# Patient Record
Sex: Male | Born: 1960
Health system: Southern US, Community
[De-identification: ages and names within clinical notes are randomized; demographics above are authoritative.]

## PROBLEM LIST (undated history)

## (undated) DIAGNOSIS — J449 Chronic obstructive pulmonary disease, unspecified: Secondary | ICD-10-CM

## (undated) DIAGNOSIS — R5383 Other fatigue: Secondary | ICD-10-CM

## (undated) DIAGNOSIS — R0602 Shortness of breath: Secondary | ICD-10-CM

## (undated) DIAGNOSIS — F209 Schizophrenia, unspecified: Secondary | ICD-10-CM

## (undated) DIAGNOSIS — R569 Unspecified convulsions: Secondary | ICD-10-CM

## (undated) DIAGNOSIS — F319 Bipolar disorder, unspecified: Secondary | ICD-10-CM

## (undated) DIAGNOSIS — F419 Anxiety disorder, unspecified: Secondary | ICD-10-CM

## (undated) HISTORY — PX: OTHER SURGICAL HISTORY: SHX169

## (undated) HISTORY — DX: Other fatigue: R53.83

## (undated) HISTORY — DX: Shortness of breath: R06.02

## (undated) HISTORY — DX: Anxiety disorder, unspecified: F41.9

## (undated) HISTORY — DX: Bipolar disorder, unspecified: F31.9

## (undated) HISTORY — DX: Schizophrenia, unspecified: F20.9

---

## 2006-11-20 DIAGNOSIS — F319 Bipolar disorder, unspecified: Secondary | ICD-10-CM

## 2006-11-20 DIAGNOSIS — F209 Schizophrenia, unspecified: Secondary | ICD-10-CM

## 2006-11-20 HISTORY — DX: Bipolar disorder, unspecified: F31.9

## 2006-11-20 HISTORY — DX: Schizophrenia, unspecified: F20.9

## 2009-08-11 ENCOUNTER — Ambulatory Visit: Payer: Self-pay | Admitting: Family Medicine

## 2009-12-14 ENCOUNTER — Emergency Department (HOSPITAL_COMMUNITY): Admission: EM | Admit: 2009-12-14 | Discharge: 2009-12-14 | Payer: Self-pay | Admitting: Emergency Medicine

## 2010-06-27 ENCOUNTER — Emergency Department (HOSPITAL_COMMUNITY): Admission: EM | Admit: 2010-06-27 | Discharge: 2010-06-27 | Payer: Self-pay | Admitting: Emergency Medicine

## 2010-07-13 ENCOUNTER — Encounter: Payer: Self-pay | Admitting: Adult Health

## 2010-07-13 ENCOUNTER — Ambulatory Visit: Payer: Self-pay | Admitting: Cardiology

## 2010-07-13 ENCOUNTER — Encounter (INDEPENDENT_AMBULATORY_CARE_PROVIDER_SITE_OTHER): Payer: Self-pay | Admitting: *Deleted

## 2010-07-13 DIAGNOSIS — R079 Chest pain, unspecified: Secondary | ICD-10-CM

## 2010-07-13 DIAGNOSIS — R0602 Shortness of breath: Secondary | ICD-10-CM

## 2010-07-13 DIAGNOSIS — F17200 Nicotine dependence, unspecified, uncomplicated: Secondary | ICD-10-CM | POA: Insufficient documentation

## 2010-07-13 LAB — CONVERTED CEMR LAB
ALT: 28 units/L
AST: 19 units/L
AST: 19 units/L (ref 0–37)
Albumin: 4.8 g/dL (ref 3.5–5.2)
Alkaline Phosphatase: 88 units/L
Alkaline Phosphatase: 88 units/L (ref 39–117)
Bilirubin, Direct: 0.1 mg/dL
HDL: 49 mg/dL
HDL: 49 mg/dL (ref >39)
LDL Cholesterol: 118 mg/dL
LDL Cholesterol: 118 mg/dL — ABNORMAL HIGH (ref 0–99)
TSH: 1.616 microintl units/mL (ref 0.350–4.500)
Total CHOL/HDL Ratio: 3.7
Total Protein: 7.6 g/dL (ref 6.0–8.3)
Triglycerides: 60 mg/dL (ref <150)

## 2010-07-14 ENCOUNTER — Encounter (INDEPENDENT_AMBULATORY_CARE_PROVIDER_SITE_OTHER): Payer: Self-pay | Admitting: *Deleted

## 2010-07-15 ENCOUNTER — Encounter: Payer: Self-pay | Admitting: Cardiology

## 2010-07-15 ENCOUNTER — Telehealth (INDEPENDENT_AMBULATORY_CARE_PROVIDER_SITE_OTHER): Payer: Self-pay

## 2010-07-21 ENCOUNTER — Encounter: Payer: Self-pay | Admitting: Cardiology

## 2010-07-26 ENCOUNTER — Encounter (INDEPENDENT_AMBULATORY_CARE_PROVIDER_SITE_OTHER): Payer: Self-pay

## 2010-07-26 ENCOUNTER — Encounter: Payer: Self-pay | Admitting: Adult Health

## 2010-07-27 ENCOUNTER — Ambulatory Visit (HOSPITAL_COMMUNITY): Admission: RE | Admit: 2010-07-27 | Discharge: 2010-07-27 | Payer: Self-pay | Admitting: Cardiology

## 2010-07-27 ENCOUNTER — Ambulatory Visit: Payer: Self-pay | Admitting: Cardiology

## 2010-07-27 ENCOUNTER — Encounter: Payer: Self-pay | Admitting: Cardiology

## 2010-07-28 ENCOUNTER — Ambulatory Visit: Payer: Self-pay | Admitting: Adult Health

## 2010-08-03 ENCOUNTER — Ambulatory Visit: Payer: Self-pay | Admitting: Family Medicine

## 2010-08-03 DIAGNOSIS — M545 Low back pain: Secondary | ICD-10-CM

## 2010-08-03 DIAGNOSIS — F99 Mental disorder, not otherwise specified: Secondary | ICD-10-CM

## 2010-08-03 DIAGNOSIS — K219 Gastro-esophageal reflux disease without esophagitis: Secondary | ICD-10-CM

## 2010-10-03 ENCOUNTER — Telehealth (INDEPENDENT_AMBULATORY_CARE_PROVIDER_SITE_OTHER): Payer: Self-pay | Admitting: *Deleted

## 2010-10-27 ENCOUNTER — Ambulatory Visit: Payer: Self-pay | Admitting: Family Medicine

## 2010-10-27 DIAGNOSIS — N529 Male erectile dysfunction, unspecified: Secondary | ICD-10-CM

## 2010-10-28 LAB — CONVERTED CEMR LAB: Testosterone: 519.47 ng/dL (ref 250–890)
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
## 2010-12-20 NOTE — Miscellaneous (Signed)
Summary: labs lipids and liver 07/13/2010  Clinical Lists Changes  Observations: Added new observation of ALBUMIN: 4.8 g/dL (16/08/9603 5:40) Added new observation of PROTEIN, TOT: 7.6 g/dL (98/09/9146 8:29) Added new observation of SGPT (ALT): 28 units/L (07/13/2010 8:06) Added new observation of SGOT (AST): 19 units/L (07/13/2010 8:06) Added new observation of ALK PHOS: 88 units/L (07/13/2010 8:06) Added new observation of BILI DIRECT: 0.1 mg/dL (56/21/3086 5:78) Added new observation of LDL: 118 mg/dL (46/96/2952 8:41) Added new observation of HDL: 49 mg/dL (32/44/0102 7:25) Added new observation of TRIGLYC TOT: 60 mg/dL (36/64/4034 7:42) Added new observation of CHOLESTEROL: 179 mg/dL (59/56/3875 6:43) Added new observation of TSH: 1.616 microintl units/mL (07/13/2010 8:06)  Appended Document: labs lipids and liver 07/13/2010 No need to start statin at this time.  Will need to stop smoking and change diet.  If LDL remains elevated or stress test is abnormal will reconsider starting medication  Appended Document: labs lipids and liver 07/13/2010 results left on machine, number to call with questions

## 2010-12-20 NOTE — Letter (Signed)
Summary: Handout Printed  Printed Handout:  - Diet - Low-Fat, Low-Saturated-Fat, Low-Cholesterol Diets 
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
## 2010-12-20 NOTE — Progress Notes (Signed)
Summary: pt returning call  Phone Note Call from Patient Call back at Home Phone [phone]   Caller: pt Reason for Call: Talk to Nurse Summary of Call: Pt was returning call about LDL, no need to call patient told him what was wrote down in letter for LDL. He needs a diet sheet sent to him in mail so he knows what not to eat. Initial call taken by: Faythe Ghee,  July 15, 2010 12:37 PM  Follow-up for Phone Call        Low cholesterol diet mailed to pt. Follow-up by: Larita Fife Via LPN,  July 15, 2010 1:43 PM

## 2010-12-20 NOTE — Letter (Signed)
Summary: Handout Printed  Printed Handout:  - Diet - Low-Cholesterol Guidelines 

## 2010-12-20 NOTE — Assessment & Plan Note (Signed)
Summary: f/u stress echo to be done 07/27/10/tg   Visit Type:  Follow-up Primary Provider:  NPMD  CC:  fu on stress echo.  History of Present Illness: Jimmy Fritz is a pleasant  y/o CM we are seeing on follow-up after initial assessment for chest discomfort and DOE.  He has not history of CAD but had complained of indigestion and burning midsternal with progressive SOB.  He was prescribed nexium as OTC acid reducers were not relieving the discomfort.  He has a history of chronic back pain and had been seen by orthopedic physcians in the past.  He had a nerve block placed back then. He was schdeduled for a stress echo and risk stratification tests to include lipid studies. He states the nexium is beginning to be helpful but his breathing status has continued to bother him. He unfortunately continues to smoke.  Preventive Screening-Counseling & Management  Alcohol-Tobacco     Alcohol drinks/day: <1     Smoking Status: current  Current Medications (verified): 1)  Aspirin 325 Mg Tabs (Aspirin) .... Take 1 Tab Daily 2)  Nitrostat 0.4 Mg Subl (Nitroglycerin) .... Use As Directed For Chestpain 3)  Klonopin 1 Mg Tabs (Clonazepam) .... Take As Needed 4)  Nexium 40 Mg Cpdr (Esomeprazole Magnesium) .... Take 1 Tablet By Mouth Once Daily  Allergies (verified): No Known Drug Allergies  Vital Signs:  Patient profile:   50 year old male Weight:      164 pounds BMI:     22.32 Pulse rate:   90 / minute BP sitting:   112 / 76  (right arm)  Vitals Entered By: Dreama Saa, CNA (July 28, 2010 11:47 AM)  Physical Exam  General:  Well developed, well nourished, in no acute distress. Lungs:  Clear bilaterally to auscultation and percussion. Heart:  Non-displaced PMI, chest non-tender; regular rate and rhythm, S1, S2 without murmurs, rubs or gallops. Carotid upstroke normal, no bruit. Normal abdominal aortic size, no bruits. Femorals normal pulses, no bruits. Pedals normal pulses. No edema, no  varicosities. Pulses:  pulses normal in all 4 extremities Extremities:  No clubbing or cyanosis. Neurologic:  Alert and oriented x 3.   Impression & Recommendations:  Problem # 1:  CHEST PAIN (ICD-786.50) Review of stress EKG read by Dr. Dietrich Pates demonstrated no stress arrythmias or conduction abnormalities, negative for ischemia.  This was discussed with the patient.  He believes now that it must be his lungs.  He is advised to follow with a primary care physician for more work-up  As he does not have one, we are referring him to Dr. Lodema Hong or her PA [facility] for establishment and futher testing as their discretion.  He will see [place] on a as needed basis only. His updated medication list for this problem includes:    Aspirin 325 Mg Tabs (Aspirin) .Marland Kitchen... Take 1 tab daily    Nitrostat 0.4 Mg Subl (Nitroglycerin) ..... Use as directed for chestpain  Problem # 2:  CIGARETTE SMOKER (ICD-305.1) Smoking cessation is recommended urgently as he is having DOE.  He verbalized understanding and was states he has been told to do so by many people.  I encouraged him to really commit to doing so.  Problem # 3:  R/O HYPERLIPIDEMIA (ICD-272.4) Review of lipids demonstrated TC of 179, LDL 118, HDL 49, TG 60.  He is advised to watch his diet and avoid high cholesterol foods.  He verbalizes understanding.  Also smoking cessation would be helpful concerning these levels.  Patient Instructions: 1)  Your physician recommends that you schedule a follow-up appointment in: AS NEEDED. 2)  We have requested [facility] give you a call to get established with their office. Their number is [phone]. Please call our office back if you don't hear from them.

## 2010-12-20 NOTE — Assessment & Plan Note (Signed)
Summary: 2 wk f/u per checkout on 07/13/10/tg   Visit Type:  Follow-up Primary Nabil Bubolz:  NPMD  CC:  patient having issues with acid reflux.  History of Present Illness: Pt rescheduled as he did not come for stress test and he was to come for follow-up on this.  Nexium prescribed.  Current Medications (verified): 1)  Aspirin 325 Mg Tabs (Aspirin) .... Take 1 Tab Daily 2)  Nitrostat 0.4 Mg Subl (Nitroglycerin) .... Use As Directed For Chestpain 3)  Motrin Ib 200 Mg Tabs (Ibuprofen) .... Take As Needed 4)  Klonopin 1 Mg Tabs (Clonazepam) .... Take As Needed  Allergies (verified): No Known Drug Allergies  Vital Signs:  Patient profile:   50 year old male Weight:      162 pounds BMI:     22.05 Pulse rate:   78 / minute BP sitting:   130 / 81  (right arm)  Vitals Entered By: Dreama Saa, CNA (July 26, 2010 11:23 AM)

## 2010-12-20 NOTE — Letter (Signed)
Summary: Stress Echocardiogram Information Sheet  [facility]  [address] [place] [address]   Phone: [phone]  Fax: [phone]      July 13, 2010 MRN: [id number] light prior to the test.   Estil Daft  Doctor: Appointment Date: Appointment Time: Appointment Location: [facility]  Stress Echocardiogram Information Sheet    Instructions:     1. Do not eat or drink after midnight the night before test.  2. Dress prepared to exercise.  3. DO NOT use ANY caffine or tobacco products 3 hours before appointment.  4. Report to the Short Stay Center on the1st floor.  5. Please bring all current prescription medications.  6. If you have any questions, please call [phone]  7. You may take your medications the morning of test.

## 2010-12-20 NOTE — Miscellaneous (Signed)
Summary: Medications update  Clinical Lists Changes  Medications: Added new medication of NEXIUM 40 MG CPDR (ESOMEPRAZOLE MAGNESIUM) take 1 tablet by mouth once daily - Signed Changed medication from ASPIRIN 325 MG TABS (ASPIRIN) take 1 tab daily to ASPIRIN 325 MG TABS (ASPIRIN) take 1 tab daily - Signed Rx of NEXIUM 40 MG CPDR (ESOMEPRAZOLE MAGNESIUM) take 1 tablet by mouth once daily;  #30 x 3;  Signed;  Entered by: Larita Fife Via LPN;  Authorized by: Joni Reining, NP;  Method used: Electronically to [facility] [address], [place], [place], [place]  [address], Ph: [phone], Fax: [phone] Rx of ASPIRIN 325 MG TABS (ASPIRIN) take 1 tab daily;  #30 x 3;  Signed;  Entered by: Larita Fife Via LPN;  Authorized by: Joni Reining, NP;  Method used: Electronically to [facility] [address] [place], [place], [place]  [address], Ph: [phone], Fax: [phone]    Prescriptions: ASPIRIN 325 MG TABS (ASPIRIN) take 1 tab daily  #30 x 3   Entered by:   Larita Fife Via LPN   Authorized by:   Joni Reining, NP   Signed by:   Larita Fife Via LPN on 29/93/7169   Method used:   Electronically to        [facility] (retail)       [address]       [place], [place]  [address]       Ph: [phone]       Fax: [phone]   RxID:   [id number] NEXIUM 40 MG CPDR (ESOMEPRAZOLE MAGNESIUM) take 1 tablet by mouth once daily  #30 x 3   Entered by:   Larita Fife Via LPN   Authorized by:   Joni Reining, NP   Signed by:   Larita Fife Via LPN on 19/50/9326   Method used:   Electronically to        [facility] (retail)       [address]       [place], [place]  [address]       Ph: [phone]       Fax: [phone]   RxID:   [id number]  Appt. rescheduled due to pt. not having stress echo performed. Per Joni Reining, NP pt. is advised to start taking Nexium 40mg  by mouth once daily.

## 2010-12-22 NOTE — Assessment & Plan Note (Signed)
Summary: wellness check for [employer]/CPT [phone]   Vital Signs:  Patient profile:   50 year old male Height:      70.25 inches Weight:      164.75 pounds BMI:     23.56 O2 Sat:      98 % on Room air Pulse rate:   86 / minute Pulse rhythm:   regular Resp:     16 per minute BP sitting:   100 / 70  (left arm)  Vitals Entered By: Adella Hare LPN (October 27, 2010 2:37 PM)  O2 Flow:  Room air CC: wellness check Is Patient Diabetic? No   Primary Care Provider:  NPMD  CC:  wellness check.  History of Present Illness: Reports  that he has been doing fairly well, He continues to smoke and states his alcohol use is decreased. He is followed closely by mentalhealth and reports no change in his condition. Denies recent fever or chills. Denies sinus pressure, nasal congestion , ear pain or sore throat. Denies chest congestion, or cough productive of sputum. Denies chest pain, palpitations, PND, orthopnea or leg swelling. Denies abdominal pain, nausea, vomitting, diarrhea or constipation. Denies change in bowel movements or bloody stool. Denies dysuria , frequency, incontinence or hesitancy.Concerned about eD. Denies  joint pain, swelling, or reduced mobility. Denies headaches, vertigo, seizures.  Denies  rash, lesions, or itch.     Preventive Screening-Counseling & Management  Alcohol-Tobacco     Smoking Cessation Counseling: yes  Current Medications (verified): 1)  Aspirin 325 Mg Tabs (Aspirin) .... Take 1 Tab Daily 2)  Klonopin 1 Mg Tabs (Clonazepam) .... One Tab By Mouth Three Times A Day 3)  Pantoprazole Sodium 40 Mg Tbec (Pantoprazole Sodium) .... Take 1 Daily  Allergies (verified): No Known Drug Allergies  Past History:  Past Medical History: fatigue sob anxiety schizphrenia /bipolar  2008psych in [place]   Social History: Disability approx 3 yrs Divorce since 2005  Tobacco Use - Yes 15/day Alcohol Use - yes(occasionally) Regular Exercise - no Drug Use -  no  Review of Systems      See HPI General:  Complains of fatigue. Eyes:  Denies discharge and red eye. GU:  Complains of decreased libido. Psych:  Complains of alternate hallucination ( auditory/visual), anxiety, depression, mental problems, and unusual visions or sounds; denies suicidal thoughts/plans and thoughts of violence. Endo:  Denies cold intolerance, excessive hunger, excessive thirst, excessive urination, and heat intolerance. Heme:  Denies abnormal bruising and bleeding. Allergy:  Denies hives or rash and itching eyes.  Physical Exam  General:  Well-developed,well-nourished,in no acute distress; alert,appropriate and cooperative throughout examination HEENT: No facial asymmetry,  EOMI, No sinus tenderness, TM's Clear, oropharynx  pink and moist.   Chest: Clear to auscultation bilaterally.  CVS: S1, S2, No murmurs, No S3.   Abd: Soft, Nontender.  MS: Adequate ROM spine, hips, shoulders and knees.  Ext: No edema.   CNS: CN 2-12 intact, power tone and sensation normal throughout.   Skin: Intact, no visible lesions or rashes.  Psych: Good eye contact,.  Memory intact,mildly anxious and  depressed appearing.    Impression & Recommendations:  Problem # 1:  ERECTILE DYSFUNCTION, ORGANIC (ICD-607.84) Assessment Deteriorated  His updated medication list for this problem includes:    Viagra 100 Mg Tabs (Sildenafil citrate) ..... One half or one tablet before intercourse as neded  Orders: T-Testosterone; Total [phone])  Problem # 2:  GERD (ICD-530.81) Assessment: Unchanged  His updated medication list for this  problem includes:    Pantoprazole Sodium 40 Mg Tbec (Pantoprazole sodium) .Marland Kitchen... Take 1 daily  Problem # 3:  LOW BACK PAIN, CHRONIC (ICD-724.2) Assessment: Unchanged  His updated medication list for this problem includes:    Aspirin 325 Mg Tabs (Aspirin) .Marland Kitchen... Take 1 tab daily  Discussed use of moist heat or ice, modified activities,  medications, and stretching/strengthening exercises.   Problem # 4:  SCHIZOPHRENIA (ICD-295.90) Assessment: Unchanged followed by psychiatry  Complete Medication List: 1)  Aspirin 325 Mg Tabs (Aspirin) .... Take 1 tab daily 2)  Klonopin 1 Mg Tabs (Clonazepam) .... One tab by mouth three times a day 3)  Pantoprazole Sodium 40 Mg Tbec (Pantoprazole sodium) .... Take 1 daily 4)  Viagra 100 Mg Tabs (Sildenafil citrate) .... One half or one tablet before intercourse as neded  Patient Instructions: 1)  Please schedule a follow-up appointment in 3 months. 2)  Tobacco is very bad for your health and your loved ones! You Should stop smoking!. 3)  It is not healthy  for men to drink more than 2-3 drinks per day or for women to drink more than 1-2 drinks per day. 4)  testosterone level today 5)  you will get samples of viagra today   Orders Added: 1)  Est. Patient Level IV [19147] 2)  T-Testosterone; Total [id number]    viagra sample given [id number] exo 07/15

## 2011-01-26 ENCOUNTER — Ambulatory Visit: Payer: Self-pay | Admitting: Family Medicine

## 2011-02-03 ENCOUNTER — Telehealth: Payer: Self-pay | Admitting: Family Medicine

## 2011-02-03 LAB — DIFFERENTIAL
Eosinophils Relative: 1 % (ref 0–5)
Lymphocytes Relative: 32 % (ref 12–46)
Lymphs Abs: 2.9 10*3/uL (ref 0.7–4.0)
Monocytes Absolute: 0.4 10*3/uL (ref 0.1–1.0)
Monocytes Relative: 5 % (ref 3–12)

## 2011-02-03 LAB — BASIC METABOLIC PANEL
BUN: 7 mg/dL (ref 6–23)
Creatinine, Ser: 0.8 mg/dL (ref 0.4–1.5)
Potassium: 4.2 mEq/L (ref 3.5–5.1)

## 2011-02-03 LAB — POCT CARDIAC MARKERS
CKMB, poc: <1 ng/mL — ABNORMAL LOW (ref 1.0–8.0)
CKMB, poc: <1 ng/mL — ABNORMAL LOW (ref 1.0–8.0)
Myoglobin, poc: 42.5 ng/mL (ref 12–200)
Myoglobin, poc: 52.5 ng/mL (ref 12–200)
Troponin i, poc: <0.05 ng/mL (ref 0.00–0.09)
Troponin i, poc: <0.05 ng/mL (ref 0.00–0.09)

## 2011-02-03 LAB — CBC
HCT: 46.4 % (ref 39.0–52.0)
MCV: 93.9 fL (ref 78.0–100.0)
RDW: 13.3 % (ref 11.5–15.5)

## 2011-02-07 ENCOUNTER — Encounter: Payer: Self-pay | Admitting: Family Medicine

## 2011-02-07 NOTE — Progress Notes (Signed)
  Phone Note Call from Patient   Caller: Patient Summary of Call: patient states he is really fatigued and wants b12 injections, advised patient he needs to schedule follow up appt, dr Huckleberry Martinson can order bloodwork and assess the need patient agrees and scheduled appt Initial call taken by: Adella Hare LPN,  February 03, 2011 11:12 AM

## 2011-02-07 NOTE — Progress Notes (Signed)
Summary: please be aware  Phone Note Other Incoming   Action Taken: Appt scheduled Summary of Call: FYI: [employer] called to set up a Wellness check up they asked that when he comes in on the 9th of Dec. to please use CPT G0439. Initial call taken by: Curtis Sites,  October 03, 2010 2:43 PM  Follow-up for Phone Call        pls correct the code if a problem and give me a note the DAY of his visit , thanks Follow-up by: Syliva Overman MD,  October 03, 2010 5:45 PM  Additional Follow-up for Phone Call Additional follow up Details #1::        since pt has been seen pls adjust the code appropriately, thanks Additional Follow-up by: Syliva Overman MD,  November 06, 2010 8:05 AM

## 2011-02-08 ENCOUNTER — Encounter: Payer: Self-pay | Admitting: Family Medicine

## 2011-02-09 ENCOUNTER — Ambulatory Visit: Payer: Self-pay | Admitting: Family Medicine

## 2011-02-09 ENCOUNTER — Telehealth: Payer: Self-pay | Admitting: Family Medicine

## 2011-02-09 ENCOUNTER — Encounter: Payer: Self-pay | Admitting: Family Medicine

## 2011-02-09 ENCOUNTER — Ambulatory Visit (INDEPENDENT_AMBULATORY_CARE_PROVIDER_SITE_OTHER): Payer: MEDICARE | Admitting: Family Medicine

## 2011-02-09 VITALS — BP 128/82 | HR 73 | Resp 16 | Ht 70.5 in | Wt 162.0 lb

## 2011-02-09 DIAGNOSIS — R5381 Other malaise: Secondary | ICD-10-CM

## 2011-02-09 DIAGNOSIS — J449 Chronic obstructive pulmonary disease, unspecified: Secondary | ICD-10-CM

## 2011-02-09 DIAGNOSIS — R5383 Other fatigue: Secondary | ICD-10-CM

## 2011-02-09 DIAGNOSIS — N529 Male erectile dysfunction, unspecified: Secondary | ICD-10-CM

## 2011-02-09 DIAGNOSIS — Z125 Encounter for screening for malignant neoplasm of prostate: Secondary | ICD-10-CM

## 2011-02-09 DIAGNOSIS — Z13220 Encounter for screening for lipoid disorders: Secondary | ICD-10-CM

## 2011-02-09 MED ORDER — TADALAFIL 20 MG PO TABS: 20.0000 mg | ORAL_TABLET | Freq: Every day | ORAL | Status: AC | PRN

## 2011-02-09 MED ORDER — BUDESONIDE-FORMOTEROL FUMARATE 80-4.5 MCG/ACT IN AERO: 2.0000 | INHALATION_SPRAY | Freq: Two times a day (BID) | RESPIRATORY_TRACT | Status: DC

## 2011-02-09 MED ORDER — PANTOPRAZOLE SODIUM 40 MG PO TBEC: 40.0000 mg | DELAYED_RELEASE_TABLET | Freq: Every day | ORAL | Status: DC

## 2011-02-09 NOTE — Telephone Encounter (Signed)
ERROR

## 2011-02-09 NOTE — Progress Notes (Signed)
  Subjective:    Patient ID: Jimmy Fritz, male    DOB: [birth year], 50 y.o.   MRN: [id number]  HPI 4 month h/o exertional fatigue which is progressively worsening, he has been experiencing chest tightness with activity , no nausea, diaphoresis , or lightheadedness with activity. Stress test approx 6 month ago which was reportedly norma.He continue to smoke on avg 1 PPD with no quit date in mind. He states he keeps his psych appts for schizophrenia, and this is stable. He is concerned about the cost of ED medication which hew needs since he experiences poor quality of erections .l    Review of Systems Denies recent fever or chills. Denies sinus pressure, nasal congestion, ear pain or sore throat. Denies chest congestion, productive cough or wheezing.He does have a chronic smoker's cough. Denies chest pains, palpitations, paroxysmal nocturnal dyspnea, orthopnea and leg swelling Denies abdominal pain, nausea, vomiting,diarrhea or constipation.  Denies rectal bleeding or change in bowel movement. Denies dysuria, frequency, hesitancy or incontinence. Denies joint pain, swelling and limitation and mobility. Denies headaches, seizure, numbness, or tingling. Reports  depression, anxiety and insomnia.These are however controlled by his psych meds Denies skin break down or rash.        Objective:   Physical Exam Patient alert and oriented and in no Cardiopulmonary distress.  HEENT: No facial asymmetry, EOMI, no sinus tenderness,  Oropharynx pink and moist.  Neck supple no adenopathy.  Chest: Clear to auscultation bilaterally.Marked decreased air entry throughout.  CVS: S1, S2 no murmurs, no S3.  ABD: Soft non tender. Bowel sounds normal.  Ext: No edema  MS: Adequate ROM spine, shoulders, hips and knees.  Skin: Intact, no ulcerations or rash noted.  Psych: Good eye contact, flat affect. Memory intact depressed appearing.  CNS: CN 2-12 intact, power, tone and sensation normal  throughout.        Assessment & Plan:  1. COPD: deteriorating, pt counseled to stop smoking , and will start symbicort. 2.ED:unchanged script for cialis provided, and i also encouraged the pt to see a urologist for alterbative therapy which may be more affordable 3.GERD:continue current med, and  Encouraged  To reduce in caffeine and nicotine

## 2011-02-09 NOTE — Telephone Encounter (Signed)
Called patient, left message.

## 2011-02-09 NOTE — Patient Instructions (Addendum)
F/u in 5 months.  You really do need to stop smoking,I do believe, since you report a totally normal stress test your shortness of breath is most likely due lung disease from smoking cigarettes for many years.  I do recommend use of an inhaler to assist with your breathing.You will get a coupon and a sample if we have any.  I will send in a script for cialis

## 2011-02-09 NOTE — Progress Notes (Signed)
Sample of symbicort 80/4.5 given Lot [id number] Exp 05/13

## 2011-03-06 ENCOUNTER — Encounter: Payer: Self-pay | Admitting: Family Medicine

## 2011-04-28 ENCOUNTER — Encounter: Payer: Self-pay | Admitting: Family Medicine

## 2011-05-03 ENCOUNTER — Ambulatory Visit (INDEPENDENT_AMBULATORY_CARE_PROVIDER_SITE_OTHER): Payer: Medicare Other | Admitting: Family Medicine

## 2011-05-03 ENCOUNTER — Encounter: Payer: Self-pay | Admitting: Family Medicine

## 2011-05-03 VITALS — BP 130/82 | HR 76 | Resp 16 | Ht 70.5 in | Wt 151.0 lb

## 2011-05-03 DIAGNOSIS — F209 Schizophrenia, unspecified: Secondary | ICD-10-CM

## 2011-05-03 DIAGNOSIS — M549 Dorsalgia, unspecified: Secondary | ICD-10-CM

## 2011-05-03 DIAGNOSIS — M545 Low back pain: Secondary | ICD-10-CM

## 2011-05-03 DIAGNOSIS — F17200 Nicotine dependence, unspecified, uncomplicated: Secondary | ICD-10-CM

## 2011-05-03 MED ORDER — IBUPROFEN 800 MG PO TABS: 800.0000 mg | ORAL_TABLET | Freq: Three times a day (TID) | ORAL | Status: AC | PRN

## 2011-05-03 MED ORDER — PREDNISONE (PAK) 5 MG PO TABS: 5.0000 mg | ORAL_TABLET | ORAL | Status: AC
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
## 2011-05-03 NOTE — Patient Instructions (Addendum)
F/U as before.  You need an xray of your back before i can prescribe the brace, no appt needed  Med is sent in for the back pain and you will be referred to the pain clinic in [place] where you went before

## 2011-05-03 NOTE — Progress Notes (Signed)
Patient declined Tdap. States he had one 7 years ago.

## 2011-05-04 ENCOUNTER — Ambulatory Visit (HOSPITAL_COMMUNITY)
Admission: RE | Admit: 2011-05-04 | Discharge: 2011-05-04 | Disposition: A | Discharge status: Home / Self Care | Payer: Medicare Other | Source: Ambulatory Visit | Attending: Family Medicine | Admitting: Family Medicine

## 2011-05-04 DIAGNOSIS — R209 Unspecified disturbances of skin sensation: Secondary | ICD-10-CM | POA: Insufficient documentation

## 2011-05-04 DIAGNOSIS — M545 Low back pain, unspecified: Secondary | ICD-10-CM | POA: Insufficient documentation

## 2011-05-04 NOTE — Assessment & Plan Note (Signed)
Stable, treated by psych

## 2011-05-04 NOTE — Assessment & Plan Note (Signed)
Unchanged, counseled to quit 

## 2011-05-04 NOTE — Assessment & Plan Note (Signed)
Acute flare , with reported sensory changed, xray spine, steroid dose pack and referral to pain clinic, has had treatment there in the remote past

## 2011-05-05 ENCOUNTER — Encounter: Payer: Self-pay | Admitting: Family Medicine

## 2011-05-05 NOTE — Progress Notes (Signed)
Patient aware.

## 2011-05-07 ENCOUNTER — Emergency Department (HOSPITAL_COMMUNITY)
Admission: EM | Admit: 2011-05-07 | Discharge: 2011-05-07 | Disposition: A | Discharge status: Other Acute Inpatient Hospital | Payer: Medicare Other | Source: Home / Self Care | Attending: Emergency Medicine | Admitting: Emergency Medicine

## 2011-05-07 ENCOUNTER — Inpatient Hospital Stay (HOSPITAL_COMMUNITY): Payer: Medicare Other

## 2011-05-07 ENCOUNTER — Emergency Department (HOSPITAL_COMMUNITY): Payer: Medicare Other

## 2011-05-07 ENCOUNTER — Inpatient Hospital Stay (HOSPITAL_COMMUNITY)
Admission: AD | Admit: 2011-05-07 | Discharge: 2011-05-08 | DRG: 494 | Disposition: A | Discharge status: Home / Self Care | Payer: Medicare Other | Source: Other Acute Inpatient Hospital | Attending: Orthopedic Surgery | Admitting: Orthopedic Surgery

## 2011-05-07 DIAGNOSIS — S82109A Unspecified fracture of upper end of unspecified tibia, initial encounter for closed fracture: Principal | ICD-10-CM | POA: Diagnosis present

## 2011-05-07 DIAGNOSIS — Z79899 Other long term (current) drug therapy: Secondary | ICD-10-CM | POA: Insufficient documentation

## 2011-05-07 DIAGNOSIS — Y998 Other external cause status: Secondary | ICD-10-CM | POA: Insufficient documentation

## 2011-05-07 DIAGNOSIS — S83509A Sprain of unspecified cruciate ligament of unspecified knee, initial encounter: Secondary | ICD-10-CM | POA: Diagnosis present

## 2011-05-07 DIAGNOSIS — G8929 Other chronic pain: Secondary | ICD-10-CM | POA: Diagnosis present

## 2011-05-07 DIAGNOSIS — J449 Chronic obstructive pulmonary disease, unspecified: Secondary | ICD-10-CM | POA: Diagnosis present

## 2011-05-07 DIAGNOSIS — S82409A Unspecified fracture of shaft of unspecified fibula, initial encounter for closed fracture: Principal | ICD-10-CM | POA: Diagnosis present

## 2011-05-07 DIAGNOSIS — Z8659 Personal history of other mental and behavioral disorders: Secondary | ICD-10-CM | POA: Insufficient documentation

## 2011-05-07 DIAGNOSIS — X500XXA Overexertion from strenuous movement or load, initial encounter: Secondary | ICD-10-CM | POA: Diagnosis present

## 2011-05-07 DIAGNOSIS — M549 Dorsalgia, unspecified: Secondary | ICD-10-CM | POA: Diagnosis present

## 2011-05-07 DIAGNOSIS — X58XXXA Exposure to other specified factors, initial encounter: Secondary | ICD-10-CM | POA: Insufficient documentation

## 2011-05-07 DIAGNOSIS — Z791 Long term (current) use of non-steroidal anti-inflammatories (NSAID): Secondary | ICD-10-CM | POA: Insufficient documentation

## 2011-05-07 DIAGNOSIS — J4489 Other specified chronic obstructive pulmonary disease: Secondary | ICD-10-CM | POA: Diagnosis present

## 2011-05-07 DIAGNOSIS — S82209A Unspecified fracture of shaft of unspecified tibia, initial encounter for closed fracture: Secondary | ICD-10-CM | POA: Insufficient documentation

## 2011-05-07 DIAGNOSIS — F17200 Nicotine dependence, unspecified, uncomplicated: Secondary | ICD-10-CM | POA: Diagnosis present

## 2011-05-07 LAB — DIFFERENTIAL
Basophils Relative: 0 % (ref 0–1)
Eosinophils Absolute: 0.1 10*3/uL (ref 0.0–0.7)
Eosinophils Relative: 1 % (ref 0–5)
Lymphs Abs: 3.6 10*3/uL (ref 0.7–4.0)

## 2011-05-07 LAB — CBC
HCT: 45 % (ref 39.0–52.0)
Hemoglobin: 15.4 g/dL (ref 13.0–17.0)
MCV: 91.5 fL (ref 78.0–100.0)
RDW: 12.6 % (ref 11.5–15.5)
WBC: 17.4 10*3/uL — ABNORMAL HIGH (ref 4.0–10.5)

## 2011-05-07 LAB — BASIC METABOLIC PANEL
BUN: 12 mg/dL (ref 6–23)
Creatinine, Ser: 0.83 mg/dL (ref 0.50–1.35)
GFR calc Af Amer: >60 mL/min (ref >60)
GFR calc non Af Amer: >60 mL/min (ref >60)
Potassium: 3.8 mEq/L (ref 3.5–5.1)

## 2011-05-07 LAB — MRSA PCR SCREENING: MRSA by PCR: NEGATIVE

## 2011-05-07 LAB — APTT: aPTT: 29 seconds (ref 24–37)

## 2011-05-07 LAB — PROTIME-INR
INR: 1.05 (ref 0.00–1.49)
Prothrombin Time: 13.9 seconds (ref 11.6–15.2)

## 2011-05-08 ENCOUNTER — Inpatient Hospital Stay (HOSPITAL_COMMUNITY): Payer: Medicare Other

## 2011-05-08 LAB — CBC
MCH: 30.4 pg (ref 26.0–34.0)
MCHC: 33.3 g/dL (ref 30.0–36.0)
Platelets: 199 10*3/uL (ref 150–400)

## 2011-05-08 LAB — BASIC METABOLIC PANEL
Calcium: 8.1 mg/dL — ABNORMAL LOW (ref 8.4–10.5)
GFR calc Af Amer: >60 mL/min (ref >60)
GFR calc non Af Amer: >60 mL/min (ref >60)
Sodium: 139 mEq/L (ref 135–145)

## 2011-05-11 NOTE — Discharge Summary (Signed)
  NAME:  Jimmy Fritz, PITSTICK NO.:  [id number]  MEDICAL RECORD NO.:  [id number]  LOCATION:  5008                         FACILITY:  [facility]  PHYSICIAN:  Toni Arthurs, MD        DATE OF BIRTH:  1961/01/04  DATE OF ADMISSION:  05/07/2011 DATE OF DISCHARGE:  05/08/2011                              DISCHARGE SUMMARY   DISCHARGE DIAGNOSES: 1. Right tibia fracture status post intramedullary nailing. 2. History of schizophrenia. 3. Tobacco abuse.  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  He presented to the emergency room after transfer from [facility] for evaluation and treatment of this injury.  HOSPITAL COURSE:  The patient was taken to surgery last night after being admitted directly to the floor on transfer from [facility].  He underwent intramedullary nailing of his displaced right tibia fracture. He tolerated this procedure well and was transferred back to the floor in stable condition.  He postoperatively did well and made good progress with physical therapy.  He is discharged now to home in stable condition.  FOLLOWUP PLAN:  He will follow up with me in 2 weeks for suture removal.  WEIGHTBEARING STATUS:  Touchdown weightbearing right lower extremity.  DISCHARGE DIET:  Regular.  DISCHARGE MEDICATIONS:  See medication reconciliation sheet.     Toni Arthurs, MD     JH/MEDQ  D:  05/08/2011  T:  05/09/2011  Job:  [id number]  Electronically Signed by Toni Arthurs  on 05/11/2011 12:17:47 PM
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
## 2011-05-11 NOTE — Op Note (Signed)
NAME:  Jimmy Fritz, Jimmy Fritz NO.:  [id number]  MEDICAL RECORD NO.:  [id number]  LOCATION:  5008                         FACILITY:  [facility]  PHYSICIAN:  Toni Arthurs, MD        DATE OF BIRTH:  10-12-1961  DATE OF PROCEDURE:  05/07/2011 DATE OF DISCHARGE:                              OPERATIVE REPORT   PREOPERATIVE DIAGNOSIS:  Right tibia fracture.  POSTOPERATIVE DIAGNOSES: 1. Right tibia fracture. 2. Right knee hematoma with possible posterior cruciate ligament tear. 3. Right proximal fibular fracture.  PROCEDURE: 1. Intramedullary nailing right tibia fracture. 2. Intraoperative interpretation of fluoroscopic images.  SURGEON:  Toni Arthurs, MD  ANESTHESIA:  General.  IV FLUIDS:  See anesthesia record.  ESTIMATED BLOOD LOSS:  100 mL.  TOURNIQUET TIME:  One hour  COMPLICATIONS:  None apparent.  DISPOSITION:  Extubated awake and stable to recovery.  INDICATIONS FOR PROCEDURE:  The patient is a 50 year old male who got his leg caught on a rope that was pulled by a horse.  This pulled on his leg and felt immediate pain.  He was transferred from [facility] to [facility] for further treatment of this injury.  He presents now for operative treatment.  He understands the risks and benefits of this treatment as well as the alternative treatment options.  He elects to proceed.  He specifically understands risks of bleeding, infection, nerve damage, blood clots, need for additional surgery, amputation and death.  PROCEDURE IN DETAIL:  After preoperative consent was obtained and the correct operative site was identified, the patient was brought to the operating room, placed supine on the operating table.  General anesthesia was induced.  Preoperative antibiotics were administered. Surgical time-out was taken.  Right lower extremity was prepped and draped in standard sterile fashion with tourniquet around the thigh and the leg elevated on a bump.  A lateral  parapatellar incision was marked on the skin, the extremity was exsanguinated, and the tourniquet was inflated to 250 mmHg.  A previously marked incision was made.  Sharp dissection was carried down through the skin to the level of extensor retinaculum.  A lateral parapatellar arthrotomy was made taking care to preserve the synovial layer of the knee joint.  While repairing the patient's right lower extremity, it was noted that he had a large effusion.  An examination under anesthesia was carried out, he had range of motion from 5 degrees of hyperextension to 120 degrees.  He had no instability to varus or valgus stress and 0 or 30 degrees of flexion.  He had a negative pivot shift.  He had a positive posterior drawer that was 2+.  He had an equivocal Lachman.  The guide pin was then introduced anterior to the fat pad, but behind the patellar tendon.  It was inserted at the corner of the tibia on the lateral view and in line at the tibial shaft on the AP view.  The guidepin was advanced into the proximal tibia.  Appropriate position was confirmed on AP and lateral fluoroscopic views.  The awl was used to open the tibial canal.  The guidewire was then introduced in the tibial canal.  The tibia fracture was reduced.  The guidepin was passed across the fracture site and into the metaphysis bone of the distal tibia to the level of the physeal scar.  The fracture was noted to be unstable, even with the guide pin in place.  A Weber tenaculum was then used to clamp the fracture and placed percutaneously.  The fracture reduction was appropriate on AP and lateral views as was the guide pin position. The tibia was then reamed sequentially from 9 mm up to 11 mm.  A 10-mm x 37.5-mm DePuy VersaNail was selected and introduced over the guide pin. It was advanced to the level of the physeal scar.  The distal two screw holes were then drilled and filled with two screws.  This was done in percutaneous  fashion using the perfect circle technique.  The back slap attachment was then placed on the proximal end of the nail.  The nail was impacted to compress the fracture site.  The oblique medial hole was then selected and again a screw was inserted in percutaneous fashion through the proximal drill guide.  Final AP and lateral views of the proximal end of the nail, distal end of the nail fracture site all showed appropriate position and length as well as appropriate reduction of the fracture itself.  All the wounds were irrigated copiously.  The extensor retinaculum was closed with simple sutures of 0 Vicryl in watertight fashion.  Subcutaneous tissue was closed with inverted simple sutures of 2-0 Vicryl, staples were used close the skin, the remaining skin incisions were all closed with staples.  Sterile dressings were applied followed by a well-padded compression wrap.  The tourniquet had been released 1 hour.  Hemostasis was achieved prior to closure.  The patient was awakened from the anesthesia and transported to recovery room in stable condition.  FOLLOWUP PLAN:  The patient will be touch down weightbearing on the right lower extremity.  We will get AP and lateral views of the knee tomorrow to evaluate for any fracture or dislocation.  We will treat the proximal fibular fracture in closed fashion.     Toni Arthurs, MD     JH/MEDQ  D:  05/07/2011  T:  05/08/2011  Job:  [id number]  Electronically Signed by Toni Arthurs  on 05/11/2011 12:17:45 PM

## 2011-06-01 ENCOUNTER — Ambulatory Visit: Payer: Self-pay | Admitting: Pain Medicine

## 2011-07-12 ENCOUNTER — Ambulatory Visit: Payer: MEDICARE | Admitting: Family Medicine

## 2011-08-20 ENCOUNTER — Other Ambulatory Visit: Payer: Self-pay | Admitting: *Deleted

## 2011-08-20 MED ORDER — PANTOPRAZOLE SODIUM 40 MG PO TBEC: 40.0000 mg | DELAYED_RELEASE_TABLET | Freq: Every day | ORAL | Status: DC

## 2011-11-15 ENCOUNTER — Other Ambulatory Visit: Payer: Self-pay | Admitting: Family Medicine

## 2012-02-16 ENCOUNTER — Other Ambulatory Visit: Payer: Self-pay | Admitting: Family Medicine

## 2012-05-20 ENCOUNTER — Encounter: Payer: Self-pay | Admitting: Family Medicine

## 2012-05-20 ENCOUNTER — Other Ambulatory Visit: Payer: Self-pay | Admitting: Family Medicine

## 2012-05-20 ENCOUNTER — Ambulatory Visit (INDEPENDENT_AMBULATORY_CARE_PROVIDER_SITE_OTHER): Payer: Medicare Other | Admitting: Family Medicine

## 2012-05-20 ENCOUNTER — Telehealth: Payer: Self-pay | Admitting: Family Medicine

## 2012-05-20 VITALS — BP 130/88 | HR 78 | Resp 16 | Ht 70.5 in | Wt 155.4 lb

## 2012-05-20 DIAGNOSIS — Z2089 Contact with and (suspected) exposure to other communicable diseases: Secondary | ICD-10-CM

## 2012-05-20 DIAGNOSIS — Z125 Encounter for screening for malignant neoplasm of prostate: Secondary | ICD-10-CM

## 2012-05-20 DIAGNOSIS — B009 Herpesviral infection, unspecified: Secondary | ICD-10-CM

## 2012-05-20 DIAGNOSIS — H9202 Otalgia, left ear: Secondary | ICD-10-CM

## 2012-05-20 DIAGNOSIS — H9209 Otalgia, unspecified ear: Secondary | ICD-10-CM

## 2012-05-20 DIAGNOSIS — Z202 Contact with and (suspected) exposure to infections with a predominantly sexual mode of transmission: Secondary | ICD-10-CM

## 2012-05-20 DIAGNOSIS — F17200 Nicotine dependence, unspecified, uncomplicated: Secondary | ICD-10-CM

## 2012-05-20 DIAGNOSIS — F209 Schizophrenia, unspecified: Secondary | ICD-10-CM

## 2012-05-20 MED ORDER — METRONIDAZOLE 500 MG PO TABS: 500.0000 mg | ORAL_TABLET | Freq: Two times a day (BID) | ORAL | Status: AC

## 2012-05-20 MED ORDER — PANTOPRAZOLE SODIUM 40 MG PO TBEC: DELAYED_RELEASE_TABLET | ORAL | Status: DC

## 2012-05-20 NOTE — Progress Notes (Signed)
  Subjective:    Patient ID: Jimmy Fritz, male    DOB: [birth year], 51 y.o.   MRN: [id number]  HPI The PT is here for follow up and re-evaluation of chronic medical conditions, medication management and review of any available recent lab and radiology data.  Preventive health is updated, specifically  Cancer screening and Immunization.   States that Father's day in 2012 fractured right leg. This past Friday reports exposure to trichomonas   Hearing loss in left ear last week, used suction cup to remove wax, states this week hes has noted drainage from the ear feels as though he only has 30% hearing in the left ear now.  Needs gERD meds refilled  Father had lung cancer, he has an over 30 pack year history, he is counseled re the importance of smoking cessation for 5 minutes, no commitment to quitting at this time  3 week h/o oral lesions, painless, concerned      Review of Systems See HPI Denies recent fever or chills. Denies sinus pressure, nasal congestion, or sore throat. Denies chest congestion, productive cough or wheezing. Denies chest pains, palpitations and leg swelling Denies abdominal pain, nausea, vomiting,diarrhea or constipation.   Denies dysuria, frequency, hesitancy or incontinence. Denies joint pain, swelling and limitation in mobility. Denies headaches, seizures, numbness, or tingling. Schizophrenia controlled on medication and followed by psychiatry         Objective:   Physical Exam Patient alert and oriented and in no cardiopulmonary distress.  HEENT: No facial asymmetry, EOMI, no sinus tenderness,  oropharynx pink and moist.  Neck supple no adenopathy.Left TM dull and erythematous with a poor light reflex  Chest: Clear to auscultation bilaterally.Decreased air entry throughout  CVS: S1, S2 no murmurs, no S3.  ABD: Soft non tender. Bowel sounds normal. Genital;no lesions noted, no penile discharge, swabs obtained and sent for culture and blood  work also done Ext: No edema  MS: Decreased  ROM spine right  hips and knee.Abnormal gait  Skin: Intact, no ulcerations or rash noted.Papular lesions in mouth under lower lip  Psych: Good eye contact,flat affect. Memory  not anxious or depressed appearing.  CNS: CN 2-12 intact, power, tone and sensation normal throughout.        Assessment & Plan:

## 2012-05-20 NOTE — Patient Instructions (Addendum)
F/u in 4 month  You will be treated for possible trichomonas infection. Please use condoms when sexually active.  You will have specimens sent for GC and chlamydia testing, also blood tests for HIV , RPR, and hSV type 2, pSA You are being treated for left ear infection, antibiotics are prescribed for 2 weeks, please take entire course. Call if you still have hearing loss on completion of the course of medication  You need to stop smoking, you have an over 30 pack year history and your father had lung cancer.You will be referred for chest CT scan   Please think about quitting smoking.  This is very important for your health.  Consider setting a quit date, then cutting back or switching brands to prepare to stop.  Also think of the money you will save every day by not smoking.  Quick Tips to Quit Smoking: Fix a date i.e. keep a date in mind from when you would not touch a tobacco product to smoke  Keep yourself busy and block your mind with work loads or reading books or watching movies in malls where smoking is not allowed  Vanish off the things which reminds you about smoking for example match box, or your favorite lighter, or the pipe you used for smoking, or your favorite jeans and shirt with which you used to enjoy smoking, or the club where you used to do smoking  Try to avoid certain people places and incidences where and with whom smoking is a common factor to add on  Praise yourself with some token gifts from the money you saved by stopping smoking  Anti Smoking teams are there to help you. Join their programs  Anti-smoking Gums are there in many medical shops. Try them to quit smoking   Side-effects of Smoking: Disease caused by smoking cigarettes are emphysema, bronchitis, heart failures  Premature death  Cancer is the major side effect of smoking  Heart attacks and strokes are the quick effects of smoking causing sudden death  Some smokers lives end up with limbs amputated    Breathing problem or fast breathing is another side effect of smoking  Due to more intakes of smokes, carbon mono-oxide goes into your brain and other muscles of the body which leads to swelling of the veins and blockage to the air passage to lungs  Carbon monoxide blocks blood vessels which leads to blockage in the flow of blood to different major body organs like heart lungs and thus leads to attacks and deaths  During pregnancy smoking is very harmful and leads to premature birth of the infant, spontaneous abortions, low weight of the infant during birth  Fat depositions to narrow and blocked blood vessels causing heart attacks  In many cases cigarette smoking caused infertility in men

## 2012-05-21 DIAGNOSIS — B009 Herpesviral infection, unspecified: Secondary | ICD-10-CM | POA: Insufficient documentation

## 2012-05-22 LAB — GC/CHLAMYDIA PROBE AMP, GENITAL
Chlamydia, DNA Probe: NEGATIVE
GC Probe Amp, Genital: NEGATIVE

## 2012-05-22 NOTE — Assessment & Plan Note (Signed)
Cessation counseling done, no commitment to cessation at this time

## 2012-05-22 NOTE — Assessment & Plan Note (Signed)
Antivirals prescribed

## 2012-05-22 NOTE — Assessment & Plan Note (Signed)
Followed by mental health and stable 

## 2012-05-22 NOTE — Assessment & Plan Note (Signed)
Antibiotic prescribed for ear infection, pt will call back if he remains symptomatic

## 2012-05-23 IMAGING — CR DG TIBIA/FIBULA 2V*R*
2 series · 2 of 2 positions shown · non-contrast
Comparison: None

CLINICAL DATA: Injured right leg.

RIGHT TIBIA AND FIBULA - 2 VIEW

[view not recorded (1 of 2)]
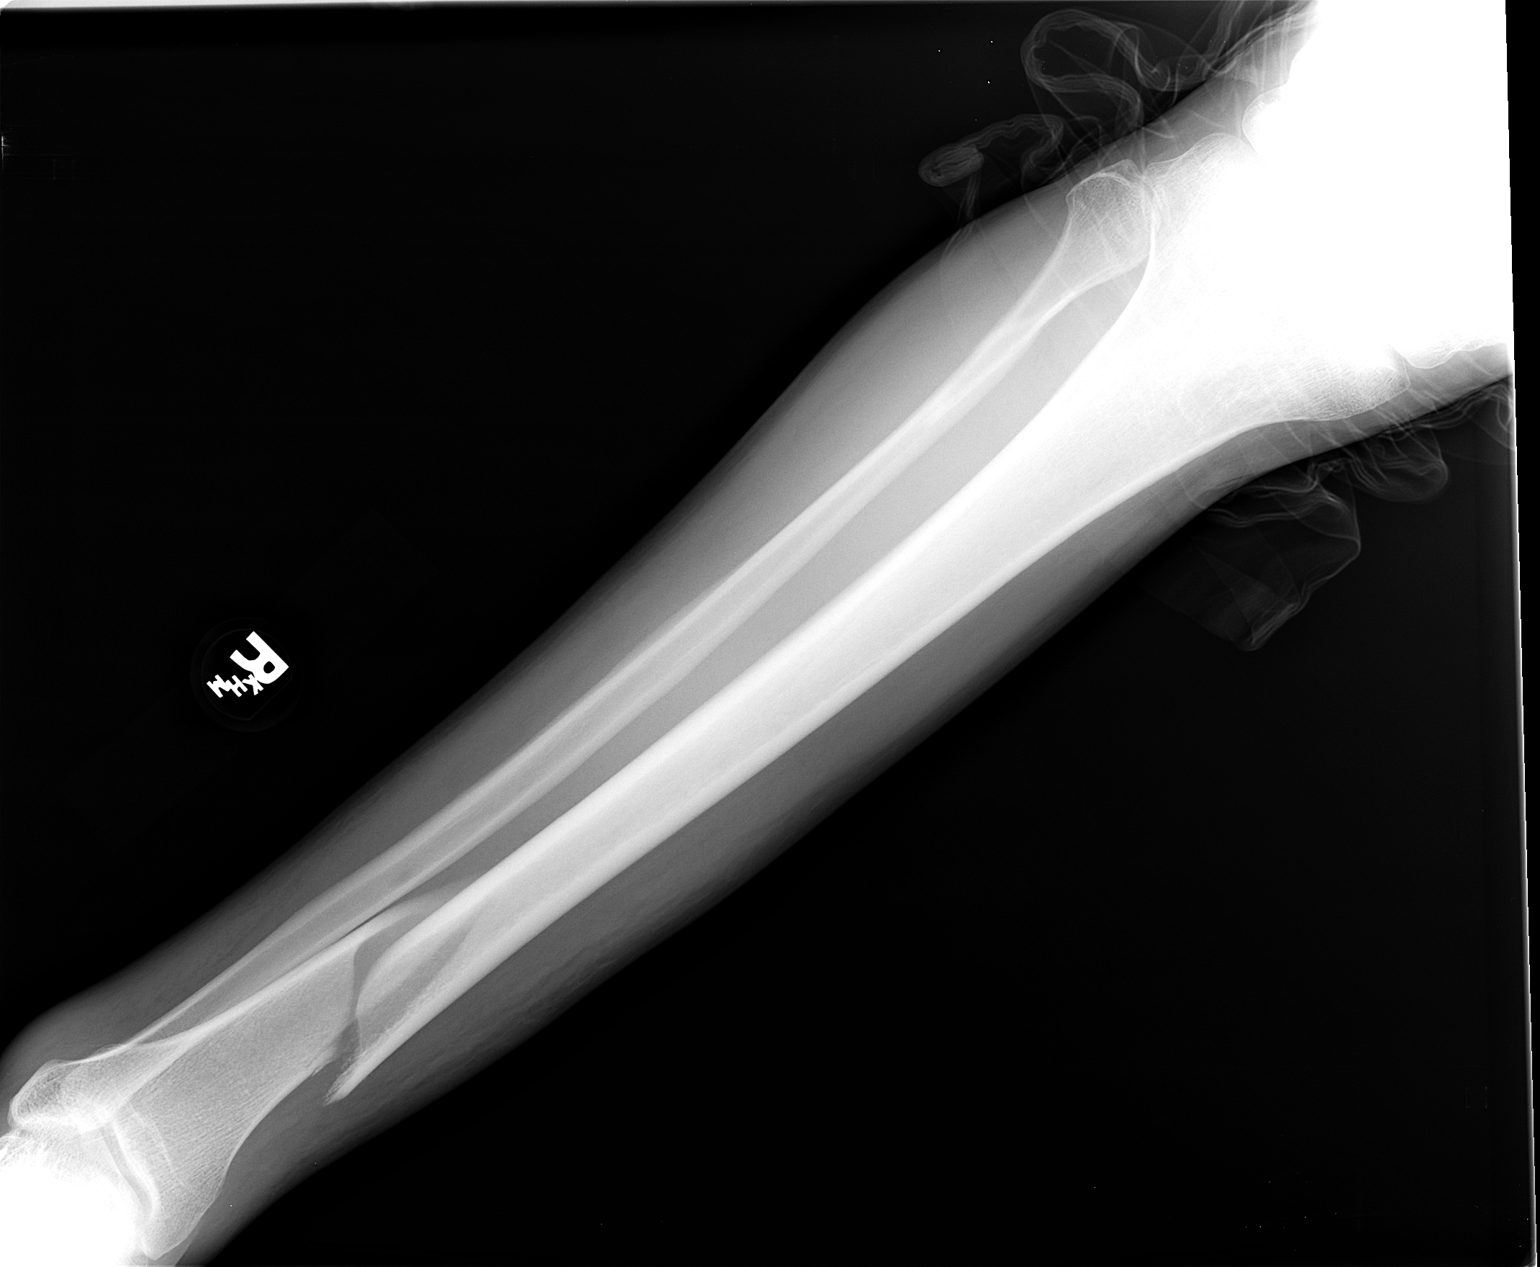

[view not recorded (2 of 2)]
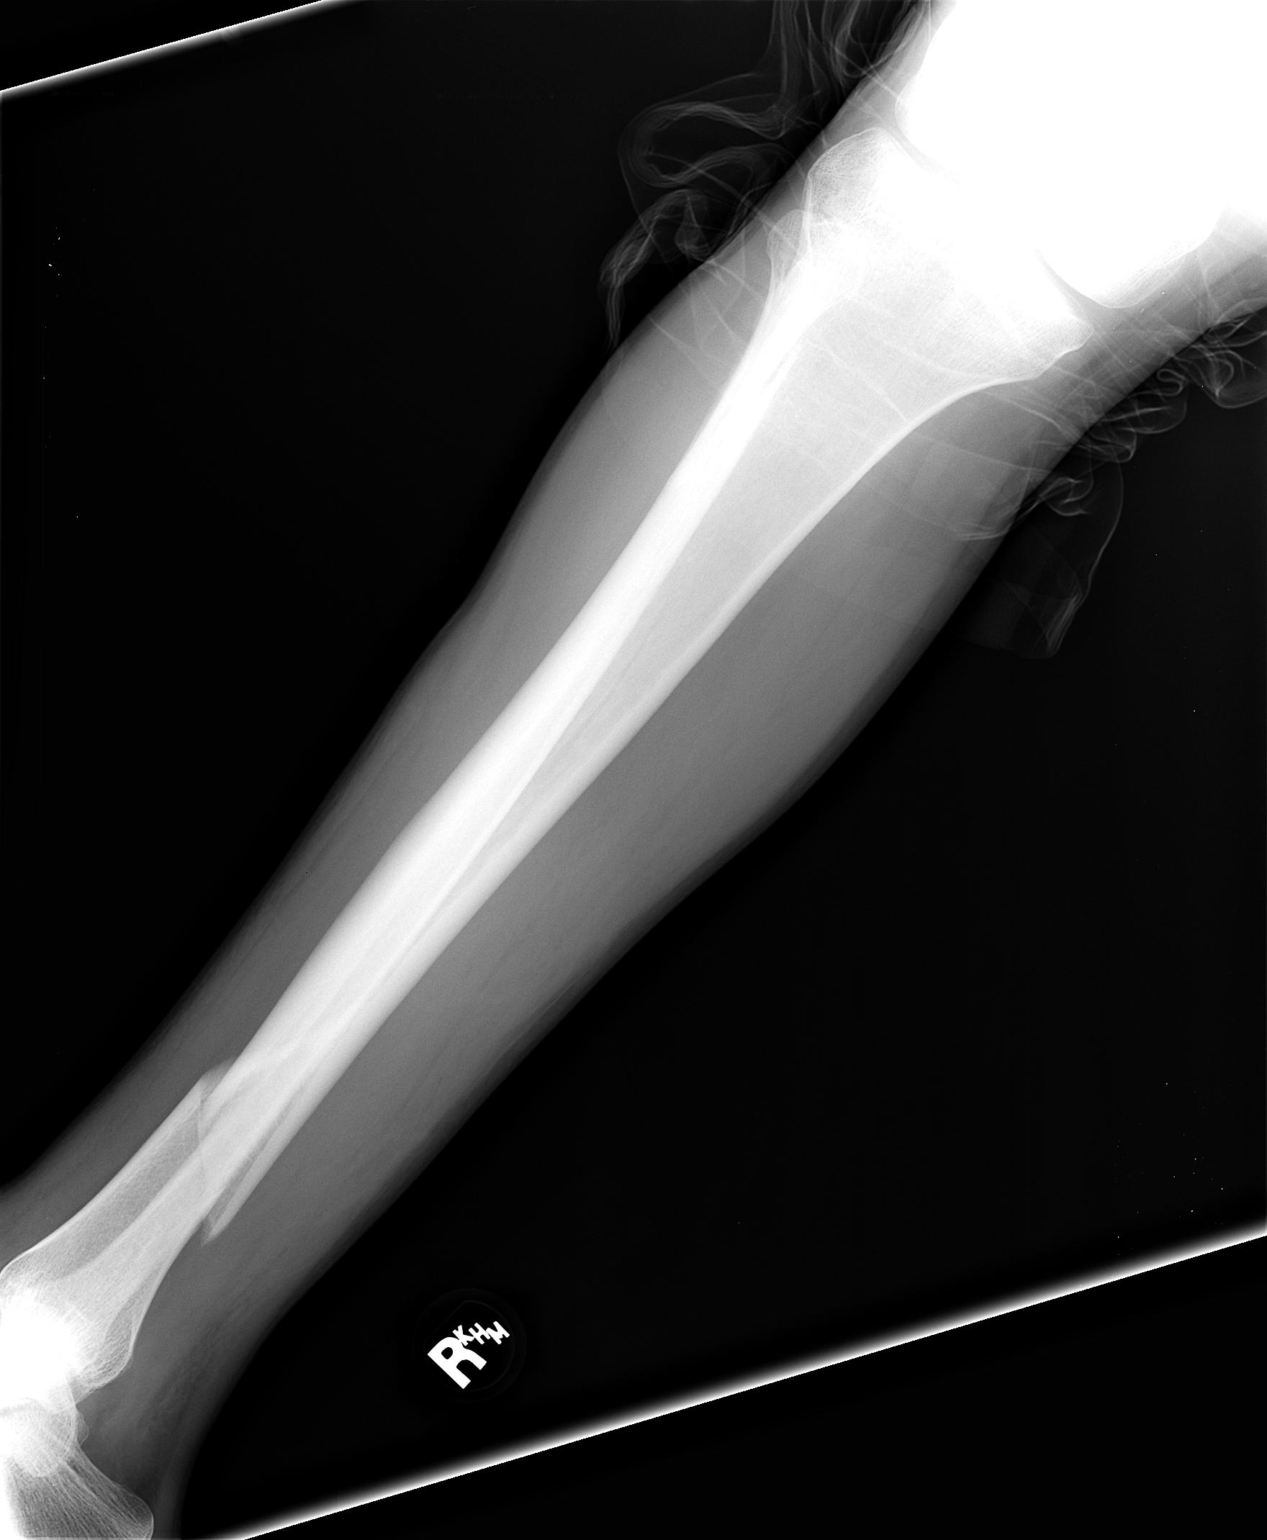

[2 of 2 positions shown; findings below may reference images not displayed]

FINDINGS: There is a spiral type fracture involving the distal
tibial shaft with one cortex width of anterior and lateral
displacement.  No definite fibular fracture.  The knee and ankle
joints are grossly maintained.
IMPRESSION: Distal tibial shaft fracture.

## 2012-05-24 IMAGING — CR DG TIBIA/FIBULA 2V*R*
4 series · 4 of 4 positions shown · non-contrast
Comparison: 05/07/2011

CLINICAL DATA: Post nailing of right tibia

RIGHT TIBIA AND FIBULA - 2 VIEW

[t knee ap right (1 of 2)]
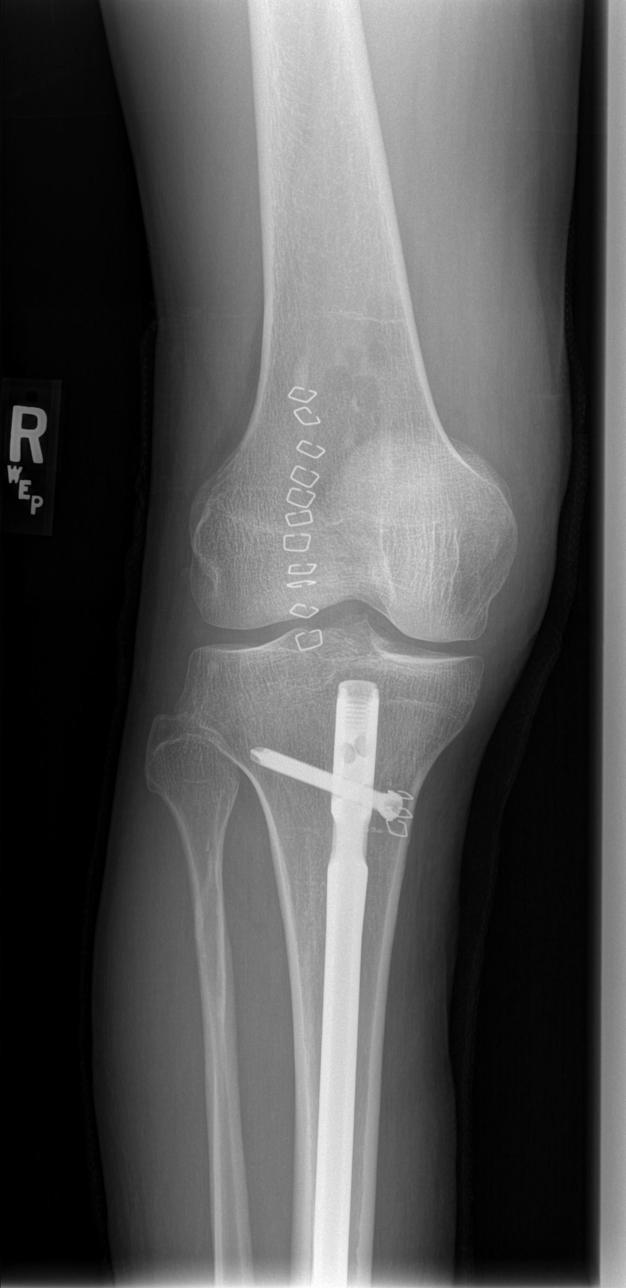

[t knee ap right (2 of 2)]
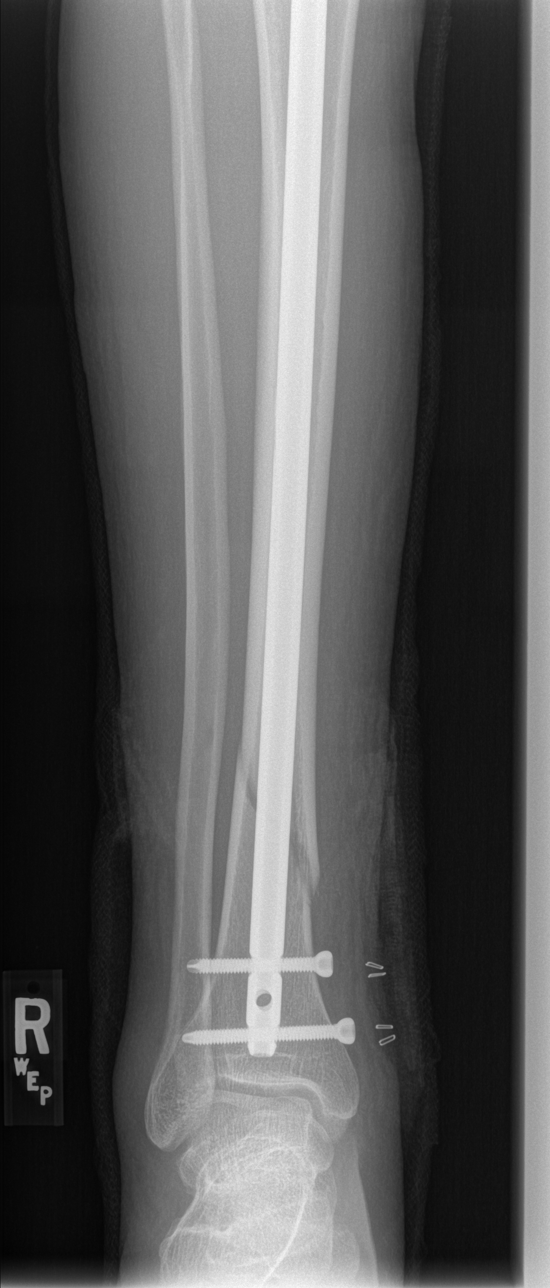

[t knee lat right (1 of 2)]
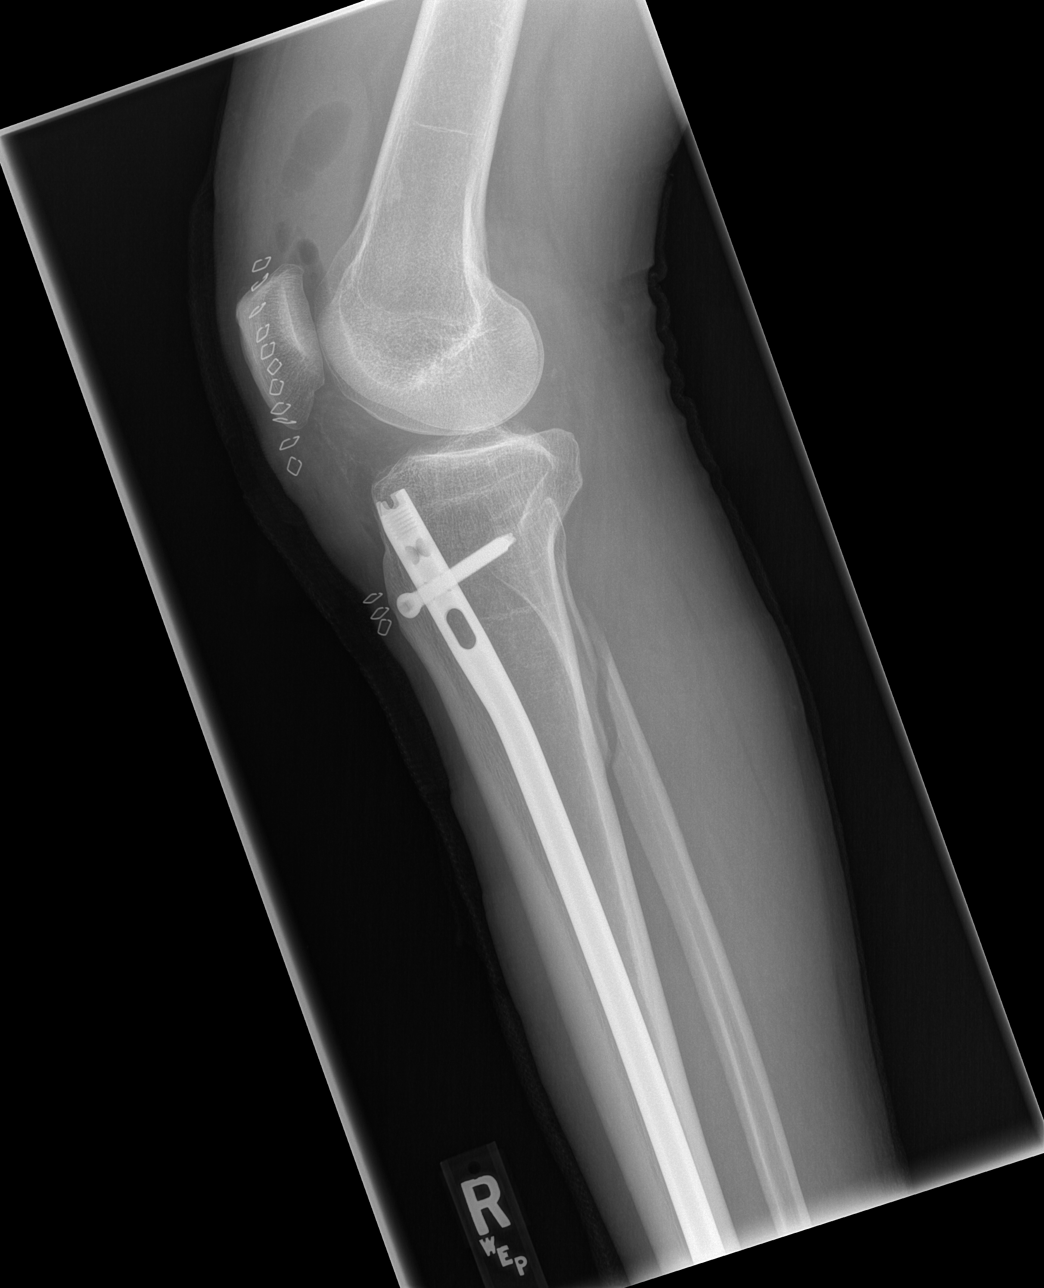

[t knee lat right (2 of 2)]
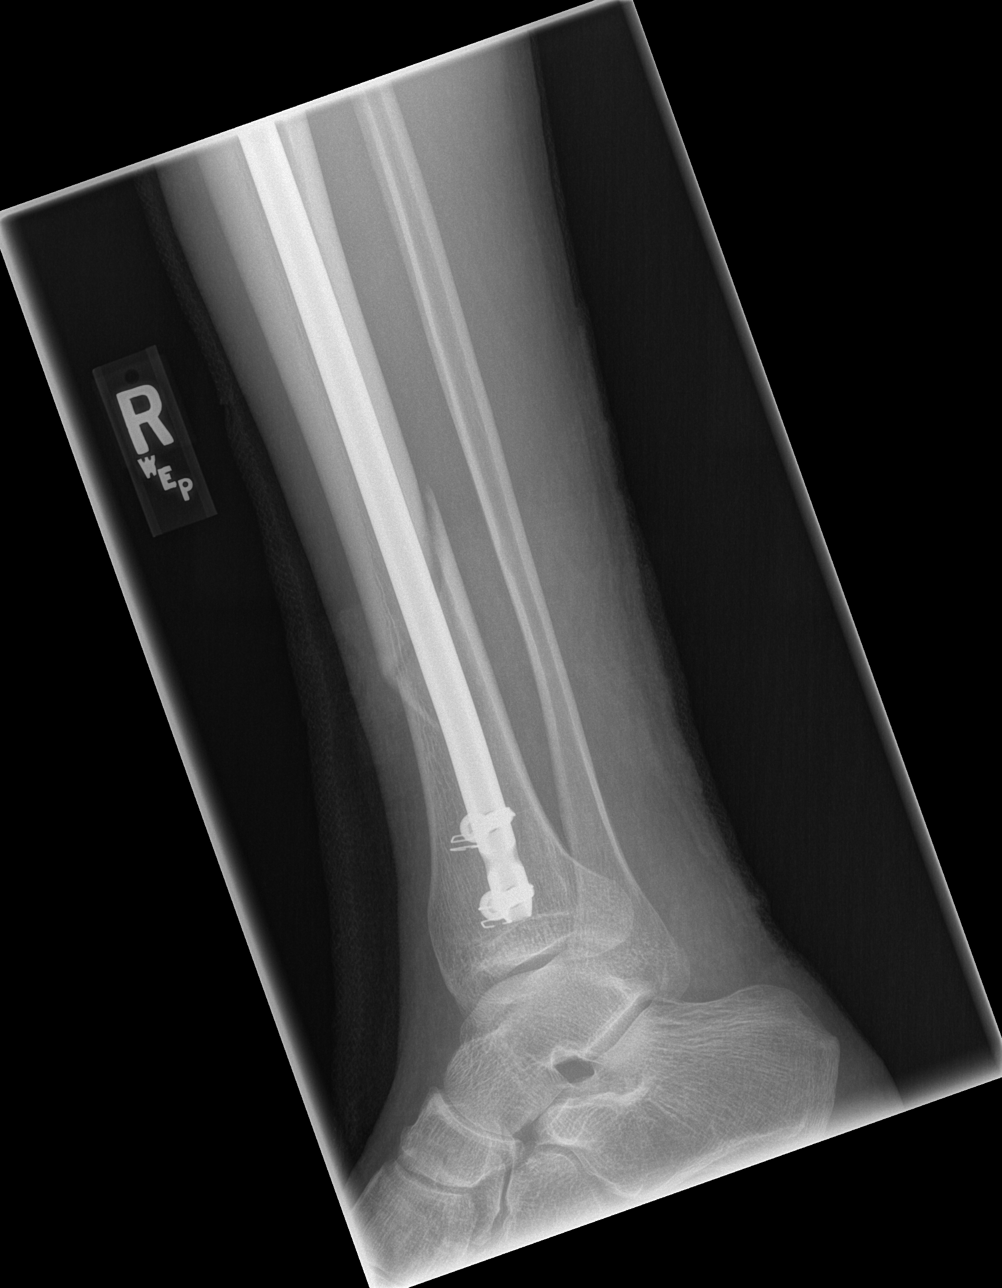

[4 of 4 positions shown; findings below may reference images not displayed]

FINDINGS: IM nail with single proximal and dual distal locking screws in
right tibia.
Oblique distal right tibial diaphyseal fracture appears reduced.
Ankle mortise and knee joint spaces intact.
Minimally displaced oblique proximal right fibular diaphyseal
fracture identified.
Postsurgical soft tissue changes right knee.
No new bony abnormalities identified.
Question osseous demineralization.
IMPRESSION: Status post nailing of right tibial diaphyseal fracture.
Minimally displaced oblique proximal right fibular diaphyseal
fracture.

## 2012-05-29 ENCOUNTER — Other Ambulatory Visit: Payer: Self-pay

## 2012-05-29 ENCOUNTER — Telehealth: Payer: Self-pay

## 2012-05-29 ENCOUNTER — Other Ambulatory Visit: Payer: Self-pay | Admitting: Family Medicine

## 2012-05-29 DIAGNOSIS — B009 Herpesviral infection, unspecified: Secondary | ICD-10-CM

## 2012-05-29 DIAGNOSIS — H6690 Otitis media, unspecified, unspecified ear: Secondary | ICD-10-CM

## 2012-05-29 MED ORDER — ACYCLOVIR 400 MG PO TABS: 400.0000 mg | ORAL_TABLET | Freq: Three times a day (TID) | ORAL | Status: DC

## 2012-05-29 NOTE — Telephone Encounter (Signed)
States his ear is still draining and that he may need something called in for it

## 2012-05-29 NOTE — Telephone Encounter (Signed)
Needs eNT eval, he should still be completing flagyll. Let him kniow he will get appt with Dr Suszanne Conners asap

## 2012-05-30 NOTE — Telephone Encounter (Signed)
Patient aware.

## 2012-06-20 ENCOUNTER — Ambulatory Visit (INDEPENDENT_AMBULATORY_CARE_PROVIDER_SITE_OTHER): Payer: Medicare Other | Admitting: Otolaryngology

## 2012-06-20 DIAGNOSIS — H66019 Acute suppurative otitis media with spontaneous rupture of ear drum, unspecified ear: Secondary | ICD-10-CM

## 2012-06-20 DIAGNOSIS — H902 Conductive hearing loss, unspecified: Secondary | ICD-10-CM

## 2012-07-01 ENCOUNTER — Telehealth: Payer: Self-pay | Admitting: Family Medicine

## 2012-07-02 ENCOUNTER — Other Ambulatory Visit: Payer: Self-pay | Admitting: Family Medicine

## 2012-07-02 MED ORDER — TRAMADOL HCL 50 MG PO TABS: 50.0000 mg | ORAL_TABLET | Freq: Four times a day (QID) | ORAL | Status: DC | PRN

## 2012-07-02 MED ORDER — CYCLOBENZAPRINE HCL 10 MG PO TABS: 10.0000 mg | ORAL_TABLET | Freq: Three times a day (TID) | ORAL | Status: DC | PRN

## 2012-07-02 NOTE — Telephone Encounter (Signed)
Patient aware.

## 2012-07-02 NOTE — Telephone Encounter (Signed)
Anything else you recommend?

## 2012-07-02 NOTE — Telephone Encounter (Signed)
pls advise I have sent on muscle rellaxant and pain pill, also needs to take care with theback

## 2012-09-23 ENCOUNTER — Ambulatory Visit (INDEPENDENT_AMBULATORY_CARE_PROVIDER_SITE_OTHER): Payer: Medicare Other | Admitting: Family Medicine

## 2012-09-23 ENCOUNTER — Encounter: Payer: Self-pay | Admitting: Family Medicine

## 2012-09-23 VITALS — BP 118/80 | HR 76 | Resp 15 | Ht 70.5 in | Wt 157.0 lb

## 2012-09-23 DIAGNOSIS — K219 Gastro-esophageal reflux disease without esophagitis: Secondary | ICD-10-CM

## 2012-09-23 DIAGNOSIS — B009 Herpesviral infection, unspecified: Secondary | ICD-10-CM

## 2012-09-23 DIAGNOSIS — N529 Male erectile dysfunction, unspecified: Secondary | ICD-10-CM

## 2012-09-23 DIAGNOSIS — M545 Low back pain, unspecified: Secondary | ICD-10-CM

## 2012-09-23 DIAGNOSIS — F489 Nonpsychotic mental disorder, unspecified: Secondary | ICD-10-CM

## 2012-09-23 DIAGNOSIS — F17200 Nicotine dependence, unspecified, uncomplicated: Secondary | ICD-10-CM

## 2012-09-23 DIAGNOSIS — F99 Mental disorder, not otherwise specified: Secondary | ICD-10-CM

## 2012-09-23 MED ORDER — ACYCLOVIR 400 MG PO TABS: 400.0000 mg | ORAL_TABLET | Freq: Three times a day (TID) | ORAL | Status: DC

## 2012-09-23 NOTE — Patient Instructions (Addendum)
Annual wellness in early March  OK to use tylenol 325mg  one daily as needed for leg pain when needed.Gabapentin is also useful  You need to continue to cut back cigarettes I hope that you quit , you need to  I am sorry to hear of your recent losses, I hope that you you will be able to get over this.  You will b get a script for the acyclovir to take with you

## 2012-09-23 NOTE — Progress Notes (Signed)
  Subjective:    Patient ID: Jimmy Fritz, male    DOB: 1961/06/14, 51 y.o.   MRN: [id number]  HPI The PT is here for follow up and re-evaluation of chronic medical conditions, medication management and review of any available recent lab and radiology data.  Preventive health is updated, specifically  Cancer screening and Immunization.   Questions or concerns regarding consultations or procedures which the PT has had in the interim are  addressed. The PT denies any adverse reactions to current medications since the last visit. Requests refill on acyclovir for as needed use, but wants to collect script and fill at local pharmacy 3 to 4 month h/o swelling and tingling in the ankles esp after walking a lot, has a pin in one ankle, and c/o increased bilateral lower extremity pain Increased sadness and depression currently due to multiple recent losses, not suicidal or homicidal also repots good support from a neighbor     Review of Systems See HPI Denies recent fever or chills. Denies sinus pressure, nasal congestion, ear pain or sore throat. Denies chest congestion, productive cough or wheezing. Denies chest pains, palpitations and leg swelling Denies abdominal pain, nausea, vomiting,diarrhea or constipation.   Denies dysuria, frequency, hesitancy or incontinence.  Denies headaches, seizures, numbness, or tingling. Denies skin break down or rash.        Objective:   Physical Exam Patient alert and oriented and in no cardiopulmonary distress.  HEENT: No facial asymmetry, EOMI, no sinus tenderness,  oropharynx pink and moist.  Neck supple no adenopathy.  Chest: Clear to auscultation bilaterally.decreased air entry throughout  CVS: S1, S2 no murmurs, no S3.  ABD: Soft non tender. Bowel sounds normal.  Ext: No edema  MS: Adequate ROM spine, shoulders, hips and knees.  Skin: Intact, no ulcerations or rash noted.  Psych: Good eye contact, normal affect.  anxious and mildly   depressed appearing.  CNS: CN 2-12 intact, power, tone and sensation normal throughout.         Assessment & Plan:

## 2012-09-24 ENCOUNTER — Other Ambulatory Visit: Payer: Self-pay

## 2012-09-24 ENCOUNTER — Telehealth: Payer: Self-pay | Admitting: Family Medicine

## 2012-09-24 MED ORDER — ASPIRIN EC 81 MG PO TBEC: 81.0000 mg | DELAYED_RELEASE_TABLET | Freq: Every day | ORAL | Status: DC

## 2012-09-24 MED ORDER — GABAPENTIN 600 MG PO TABS: 600.0000 mg | ORAL_TABLET | Freq: Three times a day (TID) | ORAL | Status: DC

## 2012-09-24 NOTE — Telephone Encounter (Signed)
Med sent.

## 2012-09-29 NOTE — Assessment & Plan Note (Signed)
Uncontrolled and worsened, pt now c/o lower extremity pain.he is to use tylenol as needed, and also continue the gabapentin

## 2012-09-29 NOTE — Assessment & Plan Note (Signed)
Controlled, no change in medication  

## 2012-09-29 NOTE — Assessment & Plan Note (Signed)
Currently feeling depressed due to multiple recent losses, not suicidal or homicidal, followed by psych

## 2012-09-29 NOTE — Assessment & Plan Note (Signed)
omgoing use , no commitment to quitting at this time Patient counseled for approximately 5 minutes regarding the health risks of ongoing nicotine use, specifically all types of cancer, heart disease, stroke and respiratory failure. The options available for help with cessation ,the behavioral changes to assist the process, and the option to either gradully reduce usage  Or abruptly stop.is also discussed. Pt is also encouraged to set specific goals in number of cigarettes used daily, as well as to set a quit date.

## 2012-09-29 NOTE — Assessment & Plan Note (Signed)
Unchanged, however , unable to afford medication

## 2012-09-29 NOTE — Assessment & Plan Note (Signed)
Currently asymptomatic, but needs script in case of a flare

## 2012-11-04 ENCOUNTER — Other Ambulatory Visit: Payer: Self-pay | Admitting: Family Medicine

## 2013-01-20 ENCOUNTER — Encounter: Payer: Self-pay | Admitting: Family Medicine

## 2013-01-20 ENCOUNTER — Ambulatory Visit (INDEPENDENT_AMBULATORY_CARE_PROVIDER_SITE_OTHER): Payer: Medicare Other | Admitting: Family Medicine

## 2013-01-20 VITALS — BP 124/80 | HR 98 | Resp 18 | Ht 70.5 in | Wt 155.0 lb

## 2013-01-20 DIAGNOSIS — Z125 Encounter for screening for malignant neoplasm of prostate: Secondary | ICD-10-CM

## 2013-01-20 DIAGNOSIS — B009 Herpesviral infection, unspecified: Secondary | ICD-10-CM

## 2013-01-20 DIAGNOSIS — F99 Mental disorder, not otherwise specified: Secondary | ICD-10-CM

## 2013-01-20 DIAGNOSIS — F17200 Nicotine dependence, unspecified, uncomplicated: Secondary | ICD-10-CM

## 2013-01-20 MED ORDER — GABAPENTIN 800 MG PO TABS: 800.0000 mg | ORAL_TABLET | Freq: Three times a day (TID) | ORAL | Status: DC

## 2013-01-20 MED ORDER — NICOTINE 10 MG IN INHA: 1.0000 | RESPIRATORY_TRACT | Status: DC | PRN

## 2013-01-20 NOTE — Progress Notes (Signed)
  Subjective:    Patient ID: Jimmy Fritz, male    DOB: October 25, 1961, 52 y.o.   MRN: [id number]  HPI The PT is here for follow up and re-evaluation of chronic medical conditions, medication management and review of any available recent lab and radiology data.  Preventive health is updated, specifically  Cancer screening and Immunization.   Questions or concerns regarding consultations or procedures which the PT has had in the interim are  Addressed.Follows regularly with his psychiatrist Dr Janeece Riggers who he last saw in 10/2012. Still very anxious and depressed, not suicidal or homicidal. Disabled on mental health grounds, I definitely encourage hi to start seeing a therapist as well  Reports being intolerant to welbutrin recently prescribed by mental health, and he has stopped takin this. C/o increased and uncontrolled back pain requests dose increase on gabapentin, and also referral back to pain specialist    Review of Systems See HPI Denies recent fever or chills. Denies sinus pressure, nasal congestion, ear pain or sore throat. Denies chest congestion, productive cough or wheezing. Denies chest pains, palpitations and leg swelling Denies abdominal pain, nausea, vomiting,diarrhea or constipation.   Denies dysuria, frequency, hesitancy or incontinence.  Denies headaches, seizures, numbness, or tingling. . Denies skin break down or rash.        Objective:   Physical Exam Patient alert and oriented and in no cardiopulmonary distress.  HEENT: No facial asymmetry, EOMI, no sinus tenderness,  oropharynx pink and moist.  Neck supple no adenopathy.  Chest: Clear to auscultation bilaterally.Decreased though adequate air entry bilaterally  CVS: S1, S2 no murmurs, no S3.  ABD: Soft non tender. Bowel sounds normal.  Ext: No edema  MS: Adequate ROM spine, shoulders, hips and knees.  Skin: Intact, no ulcerations or rash noted.  Psych: Good eye contact, flat  affect. Memory intact mildly  anxious and mildly depressed appearing.  CNS: CN 2-12 intact, power, tone and sensation normal throughout.        Assessment & Plan:

## 2013-01-20 NOTE — Patient Instructions (Addendum)
Annual wellness with rectal in end September.   Call if you need me before.  Increased dose of gabapentin is prescribed  Please plan to stop smoking by April 2.Do not use nicotine substitute and smoke  Review and sign your disability form we discussed in office, I will keep a copy in the office  . You are referred to a pain clinic in [place] as requested   Fasting lipid, cmp PSA , TSh  And vit D end September  You need to get appointments with a mental health counselor you are anxious and depressed. Please let your psychiatrist know

## 2013-01-20 NOTE — Assessment & Plan Note (Signed)
Controlled, no change in medication  

## 2013-01-20 NOTE — Assessment & Plan Note (Addendum)
Pt disabled due to mental health, states he has recently told by his psychiatrist that he remains disabled. Has a formhe requests help wit completing, and I have filled in based on his responses, and he has signed and taken this with him. Pt is clearly anxious, repeats that he has had several deaths close to him, and this bothers him, also though depressed he is neither suicidal or homicidal, he will clearly benefit from and needs psychotherapy.I recommend he discusses this with his treating psychiatrist and get this arranged

## 2013-01-20 NOTE — Assessment & Plan Note (Signed)
Not on chronic suppressive therapy, not indicated at this time

## 2013-01-20 NOTE — Assessment & Plan Note (Addendum)
Current 10 per day on 01/20/2013, quit date 02/19/2013 Pt to start nicotine replacement on 04/01 when he quits. Patient counseled for approximately 5 minutes regarding the health risks of ongoing nicotine use, specifically all types of cancer, heart disease, stroke and respiratory failure. The options available for help with cessation ,the behavioral changes to assist the process, and the option to either gradully reduce usage  Or abruptly stop.is also discussed. Pt is also encouraged to set specific goals in number of cigarettes used daily, as well as to set a quit date.

## 2013-01-20 NOTE — Assessment & Plan Note (Signed)
Increased and uncontrolled. Dose of gabapentin increased and he is referred to pain clinic , he has been treated through a pain specialist in the past with good results reportedly, possibly epidural,

## 2013-03-21 ENCOUNTER — Other Ambulatory Visit: Payer: Self-pay | Admitting: Family Medicine

## 2013-06-25 NOTE — Telephone Encounter (Signed)
error 

## 2013-07-29 ENCOUNTER — Ambulatory Visit: Payer: Medicare Other | Admitting: Family Medicine

## 2013-08-01 ENCOUNTER — Ambulatory Visit (INDEPENDENT_AMBULATORY_CARE_PROVIDER_SITE_OTHER): Payer: Medicare Other | Admitting: Family Medicine

## 2013-08-01 ENCOUNTER — Encounter: Payer: Self-pay | Admitting: Family Medicine

## 2013-08-01 VITALS — BP 128/84 | HR 76 | Resp 16 | Ht 70.5 in | Wt 150.4 lb

## 2013-08-01 DIAGNOSIS — F99 Mental disorder, not otherwise specified: Secondary | ICD-10-CM

## 2013-08-01 DIAGNOSIS — R5381 Other malaise: Secondary | ICD-10-CM

## 2013-08-01 DIAGNOSIS — Z125 Encounter for screening for malignant neoplasm of prostate: Secondary | ICD-10-CM

## 2013-08-01 DIAGNOSIS — K219 Gastro-esophageal reflux disease without esophagitis: Secondary | ICD-10-CM

## 2013-08-01 DIAGNOSIS — Z139 Encounter for screening, unspecified: Secondary | ICD-10-CM

## 2013-08-01 DIAGNOSIS — F17200 Nicotine dependence, unspecified, uncomplicated: Secondary | ICD-10-CM | POA: Insufficient documentation

## 2013-08-01 DIAGNOSIS — F489 Nonpsychotic mental disorder, unspecified: Secondary | ICD-10-CM

## 2013-08-01 DIAGNOSIS — N529 Male erectile dysfunction, unspecified: Secondary | ICD-10-CM

## 2013-08-01 DIAGNOSIS — Z1329 Encounter for screening for other suspected endocrine disorder: Secondary | ICD-10-CM

## 2013-08-01 DIAGNOSIS — Z79899 Other long term (current) drug therapy: Secondary | ICD-10-CM

## 2013-08-01 DIAGNOSIS — Z13220 Encounter for screening for lipoid disorders: Secondary | ICD-10-CM

## 2013-08-01 LAB — TSH: TSH: 0.869 u[IU]/mL (ref 0.350–4.500)

## 2013-08-01 LAB — COMPREHENSIVE METABOLIC PANEL
ALT: 21 U/L (ref 0–53)
AST: 16 U/L (ref 0–37)
Albumin: 4.3 g/dL (ref 3.5–5.2)
Calcium: 9.6 mg/dL (ref 8.4–10.5)
Chloride: 107 mEq/L (ref 96–112)
Potassium: 4.7 mEq/L (ref 3.5–5.3)
Sodium: 139 mEq/L (ref 135–145)

## 2013-08-01 LAB — CBC
MCH: 31.3 pg (ref 26.0–34.0)
MCHC: 35.1 g/dL (ref 30.0–36.0)
RDW: 13.1 % (ref 11.5–15.5)

## 2013-08-01 LAB — PSA, MEDICARE: PSA: 1.87 ng/mL (ref <=4.00)

## 2013-08-01 NOTE — Progress Notes (Signed)
  Subjective:    Patient ID: Jimmy Fritz, male    DOB: [birth year], 52 y.o.   MRN: [id number]  HPI The PT is here for follow up and re-evaluation of chronic medical conditions, medication management and review of any available recent lab and radiology data.  Preventive health is updated, specifically  Cancer screening and Immunization.  Refuses both rectal exam today for colon screen, but will proceed with PSA blood test. Refuses flu vaccine The PT denies any adverse reactions to current medications since the last visit. States he is using OTC med for GERD as more affordable and as effective There are no new concerns.  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 Denies sinus pressure, nasal congestion, ear pain or sore throat. Denies chest congestion, productive cough or wheezing. Denies chest pains, palpitations and leg swelling Denies abdominal pain, nausea, vomiting,diarrhea or constipation.   Denies dysuria, frequency, hesitancy or incontinence. Denies uncontrolled joint pain, swelling and limitation in mobility.Chronic back pain less of a concern currently Denies headaches, seizures, numbness, or tingling. Denies uncontrolled depression, anxiety or insomnia. Denies skin break down or rash.        Objective:   Physical Exam  Patient alert and oriented and in no cardiopulmonary distress.  HEENT: No facial asymmetry, EOMI, no sinus tenderness,  oropharynx pink and moist.  Neck supple no adenopathy.  Chest: Clear to auscultation bilaterally.  CVS: S1, S2 no murmurs, no S3.  ABD: Soft non tender. Bowel sounds normal.  Ext: No edema  MS: Adequate ROM spine, shoulders, hips and knees.  Skin: Intact, no ulcerations or rash noted.  Psych: Good eye contact, normal  affect. Memory intact not anxious or depressed appearing.  CNS: CN 2-12 intact, power, t normal throughout.       Assessment & Plan:

## 2013-08-01 NOTE — Patient Instructions (Addendum)
Annual wellness in novemeber   Fasting lipid, cmp, tSH, pSA and CBC today.  Please reconsider the flu vaccine you ned this  Please continue to work on smoking cessation you need to quit.  I am glad that you are feeling happier

## 2013-08-03 NOTE — Assessment & Plan Note (Signed)
Appears better controlled, stable, followed by psych. Denies depression or hallucinations

## 2013-08-03 NOTE — Assessment & Plan Note (Signed)
Unable to afford medication and no interest in pump at this time

## 2013-08-03 NOTE — Assessment & Plan Note (Signed)
Slow taper in progress, unwilling to set quit dater at this time. Patient counseled for approximately 5 minutes regarding the health risks of ongoing nicotine use, specifically all types of cancer, heart disease, stroke and respiratory failure. The options available for help with cessation ,the behavioral changes to assist the process, and the option to either gradully reduce usage  Or abruptly stop.is also discussed. Pt is also encouraged to set specific goals in number of cigarettes used daily, as well as to set a quit date.

## 2013-08-03 NOTE — Assessment & Plan Note (Signed)
Controlled on OTC medication, notes certain foods aggravate symptoms, so he avoids as much as possible

## 2013-08-18 ENCOUNTER — Encounter: Payer: Medicare Other | Admitting: Family Medicine

## 2013-09-03 ENCOUNTER — Emergency Department (HOSPITAL_COMMUNITY)
Admission: EM | Admit: 2013-09-03 | Discharge: 2013-09-04 | Disposition: A | Discharge status: Home / Self Care | Payer: Medicare Other | Attending: Emergency Medicine | Admitting: Emergency Medicine

## 2013-09-03 ENCOUNTER — Encounter (HOSPITAL_COMMUNITY): Payer: Self-pay | Admitting: Emergency Medicine

## 2013-09-03 ENCOUNTER — Emergency Department (HOSPITAL_COMMUNITY): Payer: Medicare Other

## 2013-09-03 DIAGNOSIS — F411 Generalized anxiety disorder: Secondary | ICD-10-CM | POA: Insufficient documentation

## 2013-09-03 DIAGNOSIS — Z79899 Other long term (current) drug therapy: Secondary | ICD-10-CM | POA: Insufficient documentation

## 2013-09-03 DIAGNOSIS — Z7982 Long term (current) use of aspirin: Secondary | ICD-10-CM | POA: Insufficient documentation

## 2013-09-03 DIAGNOSIS — R29898 Other symptoms and signs involving the musculoskeletal system: Secondary | ICD-10-CM

## 2013-09-03 DIAGNOSIS — H539 Unspecified visual disturbance: Secondary | ICD-10-CM | POA: Insufficient documentation

## 2013-09-03 DIAGNOSIS — F17200 Nicotine dependence, unspecified, uncomplicated: Secondary | ICD-10-CM | POA: Insufficient documentation

## 2013-09-03 DIAGNOSIS — Z8659 Personal history of other mental and behavioral disorders: Secondary | ICD-10-CM | POA: Insufficient documentation

## 2013-09-03 DIAGNOSIS — F319 Bipolar disorder, unspecified: Secondary | ICD-10-CM | POA: Insufficient documentation

## 2013-09-03 DIAGNOSIS — R5381 Other malaise: Secondary | ICD-10-CM | POA: Insufficient documentation

## 2013-09-03 LAB — CBC WITH DIFFERENTIAL/PLATELET
Basophils Relative: 0 % (ref 0–1)
Eosinophils Absolute: 0.2 10*3/uL (ref 0.0–0.7)
Eosinophils Relative: 2 % (ref 0–5)
HCT: 42.6 % (ref 39.0–52.0)
Hemoglobin: 14.8 g/dL (ref 13.0–17.0)
Lymphs Abs: 3.6 10*3/uL (ref 0.7–4.0)
MCH: 31.4 pg (ref 26.0–34.0)
Monocytes Absolute: 0.3 10*3/uL (ref 0.1–1.0)
Monocytes Relative: 4 % (ref 3–12)
Neutro Abs: 5.1 10*3/uL (ref 1.7–7.7)
Neutrophils Relative %: 55 % (ref 43–77)
RBC: 4.71 MIL/uL (ref 4.22–5.81)

## 2013-09-03 LAB — PROTIME-INR: Prothrombin Time: 13.7 seconds (ref 11.6–15.2)

## 2013-09-03 LAB — APTT: aPTT: 34 seconds (ref 24–37)

## 2013-09-03 NOTE — ED Notes (Signed)
Pt c/o intermittent rt arm numbness and rt side mouth drooling. Pt states this has been happening over the last couple of years.
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
## 2013-09-04 LAB — COMPREHENSIVE METABOLIC PANEL
Albumin: 4 g/dL (ref 3.5–5.2)
Alkaline Phosphatase: 84 U/L (ref 39–117)
BUN: 19 mg/dL (ref 6–23)
CO2: 28 mEq/L (ref 19–32)
Calcium: 9.9 mg/dL (ref 8.4–10.5)
Chloride: 96 mEq/L (ref 96–112)
GFR calc Af Amer: >90 mL/min (ref >90)
GFR calc non Af Amer: >90 mL/min (ref >90)
Glucose, Bld: 104 mg/dL — ABNORMAL HIGH (ref 70–99)
Potassium: 4 mEq/L (ref 3.5–5.1)
Total Bilirubin: 0.4 mg/dL (ref 0.3–1.2)

## 2013-09-17 ENCOUNTER — Telehealth: Payer: Self-pay

## 2013-09-17 DIAGNOSIS — J449 Chronic obstructive pulmonary disease, unspecified: Secondary | ICD-10-CM

## 2013-09-17 MED ORDER — BUDESONIDE-FORMOTEROL FUMARATE 80-4.5 MCG/ACT IN AERO: 2.0000 | INHALATION_SPRAY | Freq: Two times a day (BID) | RESPIRATORY_TRACT | Status: DC

## 2013-09-17 NOTE — Telephone Encounter (Signed)
pls refill x 3 and encourage him to stop smoking also

## 2013-09-17 NOTE — Telephone Encounter (Signed)
Called and was wanting a prescription refilled for Symbicort. Haven't had to get one from the pharmacy in over a year but has been needing one the past few days with his breathing problems getting worse. Ok to refill the last symbicort in the historical med record? Wants to collect today

## 2013-09-17 NOTE — Telephone Encounter (Signed)
Patient aware and states he has cut down a lot on his cig's and will keep working on it

## 2013-09-30 ENCOUNTER — Encounter: Payer: Self-pay | Admitting: Family Medicine

## 2013-09-30 ENCOUNTER — Encounter (INDEPENDENT_AMBULATORY_CARE_PROVIDER_SITE_OTHER): Payer: Self-pay

## 2013-09-30 ENCOUNTER — Ambulatory Visit (INDEPENDENT_AMBULATORY_CARE_PROVIDER_SITE_OTHER): Payer: Medicare Other | Admitting: Family Medicine

## 2013-09-30 VITALS — BP 130/72 | HR 80 | Resp 18 | Ht 70.5 in | Wt 144.0 lb

## 2013-09-30 DIAGNOSIS — R195 Other fecal abnormalities: Secondary | ICD-10-CM

## 2013-09-30 DIAGNOSIS — Z0000 Encounter for general adult medical examination without abnormal findings: Secondary | ICD-10-CM

## 2013-09-30 NOTE — Progress Notes (Signed)
Subjective:    Patient ID: Jimmy Fritz, male    DOB: [birth year], 52 y.o.   MRN: [id number]  HPI Preventive Screening-Counseling & Management   Patient present here today for a Medicare annual wellness visit. 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 Does state that at times he notices that his stool is stringy He will need colonoscopy in December and will refer him for this Of note , he has losed weight , but this is specifically as he is reducing fried and fatty food intake based on recent labs  Current Problems (verified)   Medications Prior to Visit Allergies (verified)   PAST HISTORY  Family History  Social History Married 3 times, father of 1 child, worked in [place] till 2006 , fired then and since then he has not worked   Risk Factors  Current exercise habits:  Physical activity 30 mins daily  Dietary issues discussed:low fat, low sugar   Cardiac risk factors:   Depression Screen  (Note: if answer to either of the following is "Yes", a more complete depression screening is indicated)   Over the past two weeks, have you felt down, depressed or hopeless? Yes  Over the past two weeks, have you felt little interest or pleasure in doing things? yes  Have you lost interest or pleasure in daily life? No  Do you often feel hopeless? yes Do you cry easily over simple problems? no   Activities of Daily Living  In your present state of health, do you have any difficulty performing the following activities?  Driving?: No Managing money?: No Feeding yourself?:No Getting from bed to chair?:No Climbing a flight of stairs?:yes, shortneess Preparing food and eating?:No Bathing or showering?:No Getting dressed?:No Getting to the toilet?:No Using the toilet?:No Moving around from  place to place?: No  Fall Risk Assessment In the past year have you fallen or had a near fall?:No Are you currently taking any medications that make you dizzy?:No   Hearing Difficulties: No Do you often ask people to speak up or repeat themselves?:No Do you experience ringing or noises in your ears?:No Do you have difficulty understanding soft or whispered voices?:No  Cognitive Testing  Alert? Yes Normal Appearance?Yes  Oriented to person? Yes Place? Yes  Time? Yes  Displays appropriate judgment?Yes  Can read the correct time from a watch face? yes Are you having problems remembering things?No  Advanced Directives have been discussed with the patient?Yes , full code   List the Names of Other Physician/Practitioners you currently use: Dr Truett Mainland)   Indicate any recent Medical Services you may have received from other than [facility] providers in the past year (date may be approximate).   Assessment:    Annual Wellness Exam   Plan:    During the course of the visit the patient was educated and counseled about appropriate screening and preventive services including:  A healthy diet is rich in fruit, vegetables and whole grains. Poultry fish, nuts and beans are a healthy choice for protein rather then red meat. A low sodium diet and drinking 64 ounces of water daily is generally recommended. Oils and sweet should be limited. Carbohydrates especially for those who are diabetic or overweight, should be limited to 30-45 gram per meal. It is important to eat on a regular  schedule, at least 3 times daily. Snacks should be primarily fruits, vegetables or nuts. It is important that you exercise regularly at least 30 minutes 5 times a week. If you develop chest pain, have severe difficulty breathing, or feel very tired, stop exercising immediately and seek medical attention  Immunization reviewed and updated. Cancer screening reviewed and updated    Patient Instructions (the written  plan) was given to the patient.  Medicare Attestation  I have personally reviewed:  The patient's medical and social history  Their use of alcohol, tobacco or illicit drugs  Their current medications and supplements  The patient's functional ability including ADLs,fall risks, home safety risks, cognitive, and hearing and visual impairment  Diet and physical activities  Evidence for depression or mood disorders  The patient's weight, height, BMI, and visual acuity have been recorded in the chart. I have made referrals, counseling, and provided education to the patient based on review of the above and I have provided the patient with a written personalized care plan for preventive services.      Review of Systems     Objective:   Physical Exam        Assessment & Plan:

## 2013-09-30 NOTE — Patient Instructions (Addendum)
F/u in 6 month, call if you need me before  Good that you are eating healthily, ok to eat larger quantities of food   Please try to stop smoking cigarettes to improve your health  Re think the flu vaccine , you need this   You will get containers for stool testing for worms , pls take the samples to the lab when you have them

## 2013-10-12 ENCOUNTER — Telehealth: Payer: Self-pay | Admitting: Family Medicine

## 2013-10-12 ENCOUNTER — Encounter: Payer: Self-pay | Admitting: Family Medicine

## 2013-10-12 DIAGNOSIS — Z0000 Encounter for general adult medical examination without abnormal findings: Secondary | ICD-10-CM | POA: Insufficient documentation

## 2013-10-12 NOTE — Telephone Encounter (Signed)
Pt due for screening colonoscopy, avg risk, by mid Dec, pls call and see if willing to be referred for this.let me know, I will refer to [facility]. He has recently lost a lot of weight , states he is eating less fat, but still recommended since he will be 52 by that time

## 2013-10-12 NOTE — Assessment & Plan Note (Addendum)
Annual wellness as documented. Pt is limited by mental health issues, and has been followed by psychiatry for years. Also has past h/o back surgery with some chronic back pain. This does not limit his mobility to any extent Healthy Diet and exerercise encouraged, advised ptr to increase calories as excessive weight loss with recent dietary change Still encouraged and counseled re the need to stop smoking , unwilling to commit to quit date at this time

## 2013-10-12 NOTE — Telephone Encounter (Signed)
Pt due for  screening colonoscopy from mid December, please contact him and see

## 2013-10-22 NOTE — Telephone Encounter (Signed)
Patient states that he is fine with the referral.

## 2013-10-28 ENCOUNTER — Telehealth: Payer: Self-pay | Admitting: *Deleted

## 2013-10-28 NOTE — Telephone Encounter (Signed)
Pt called wanting to get his nexium refilled. Please advise [phone]

## 2013-10-28 NOTE — Telephone Encounter (Signed)
States you said when he was here to call back if the otc acid reducer stops working and he was up all night in pain, wants to be put on nexium now. Please advise

## 2013-10-29 ENCOUNTER — Other Ambulatory Visit: Payer: Self-pay

## 2013-10-29 MED ORDER — ESOMEPRAZOLE MAGNESIUM 20 MG PO CPDR: 20.0000 mg | DELAYED_RELEASE_CAPSULE | Freq: Every day | ORAL | Status: DC

## 2013-10-29 NOTE — Telephone Encounter (Signed)
Med sent in.

## 2013-10-29 NOTE — Telephone Encounter (Signed)
pls send in nexium 20 mg one daily #30 refill 3, let him know

## 2014-02-23 ENCOUNTER — Telehealth: Payer: Self-pay

## 2014-02-23 NOTE — Telephone Encounter (Signed)
He needs to have the report of the worms sent from the vet's office for treatment, he can have cbc , cmp labs drawn. Needs to submit stool for testing if unable to get report and will treat with mebendazole        normally used for worms He was advised to have stool tests in the past. Unable to be seen before next 2 weeks , but may be treated presumptively after the above one, and if deteriorates in the interim urgent care pls get back to me on this

## 2014-02-26 ENCOUNTER — Ambulatory Visit (HOSPITAL_COMMUNITY)
Admission: RE | Admit: 2014-02-26 | Discharge: 2014-02-26 | Disposition: A | Discharge status: Home / Self Care | Payer: Medicare Other | Source: Ambulatory Visit | Attending: Family Medicine | Admitting: Family Medicine

## 2014-02-26 ENCOUNTER — Encounter: Payer: Self-pay | Admitting: Family Medicine

## 2014-02-26 ENCOUNTER — Other Ambulatory Visit: Payer: Self-pay

## 2014-02-26 ENCOUNTER — Ambulatory Visit (INDEPENDENT_AMBULATORY_CARE_PROVIDER_SITE_OTHER): Payer: Medicare Other | Admitting: Family Medicine

## 2014-02-26 ENCOUNTER — Other Ambulatory Visit: Payer: Self-pay | Admitting: Family Medicine

## 2014-02-26 ENCOUNTER — Encounter: Payer: Self-pay | Admitting: Gastroenterology

## 2014-02-26 ENCOUNTER — Encounter (INDEPENDENT_AMBULATORY_CARE_PROVIDER_SITE_OTHER): Payer: Self-pay

## 2014-02-26 VITALS — BP 118/80 | HR 91 | Resp 16 | Wt 157.8 lb

## 2014-02-26 DIAGNOSIS — F17200 Nicotine dependence, unspecified, uncomplicated: Secondary | ICD-10-CM

## 2014-02-26 DIAGNOSIS — B839 Helminthiasis, unspecified: Secondary | ICD-10-CM

## 2014-02-26 DIAGNOSIS — M545 Low back pain, unspecified: Secondary | ICD-10-CM

## 2014-02-26 DIAGNOSIS — K219 Gastro-esophageal reflux disease without esophagitis: Secondary | ICD-10-CM

## 2014-02-26 DIAGNOSIS — J42 Unspecified chronic bronchitis: Secondary | ICD-10-CM

## 2014-02-26 DIAGNOSIS — F99 Mental disorder, not otherwise specified: Secondary | ICD-10-CM

## 2014-02-26 DIAGNOSIS — R5383 Other fatigue: Secondary | ICD-10-CM

## 2014-02-26 DIAGNOSIS — J9819 Other pulmonary collapse: Secondary | ICD-10-CM | POA: Insufficient documentation

## 2014-02-26 DIAGNOSIS — Z1211 Encounter for screening for malignant neoplasm of colon: Secondary | ICD-10-CM

## 2014-02-26 DIAGNOSIS — R5381 Other malaise: Secondary | ICD-10-CM

## 2014-02-26 DIAGNOSIS — F489 Nonpsychotic mental disorder, unspecified: Secondary | ICD-10-CM

## 2014-02-26 LAB — CBC WITH DIFFERENTIAL/PLATELET
Basophils Absolute: 0 10*3/uL (ref 0.0–0.1)
Basophils Relative: 0 % (ref 0–1)
EOS ABS: 0.1 10*3/uL (ref 0.0–0.7)
Eosinophils Relative: 1 % (ref 0–5)
HCT: 45.7 % (ref 39.0–52.0)
HEMOGLOBIN: 15.9 g/dL (ref 13.0–17.0)
LYMPHS ABS: 3.2 10*3/uL (ref 0.7–4.0)
Lymphocytes Relative: 24 % (ref 12–46)
MCH: 31.1 pg (ref 26.0–34.0)
MCHC: 34.8 g/dL (ref 30.0–36.0)
MCV: 89.4 fL (ref 78.0–100.0)
Monocytes Absolute: 0.7 10*3/uL (ref 0.1–1.0)
Monocytes Relative: 5 % (ref 3–12)
NEUTROS ABS: 9.3 10*3/uL — AB (ref 1.7–7.7)
NEUTROS PCT: 70 % (ref 43–77)
PLATELETS: 217 10*3/uL (ref 150–400)
RBC: 5.11 MIL/uL (ref 4.22–5.81)
RDW: 13.3 % (ref 11.5–15.5)
WBC: 13.3 10*3/uL — AB (ref 4.0–10.5)

## 2014-02-26 LAB — COMPREHENSIVE METABOLIC PANEL
ALBUMIN: 4.3 g/dL (ref 3.5–5.2)
ALK PHOS: 61 U/L (ref 39–117)
ALT: 16 U/L (ref 0–53)
AST: 16 U/L (ref 0–37)
BILIRUBIN TOTAL: 0.4 mg/dL (ref 0.2–1.2)
BUN: 13 mg/dL (ref 6–23)
CO2: 27 mEq/L (ref 19–32)
Calcium: 9.8 mg/dL (ref 8.4–10.5)
Chloride: 108 mEq/L (ref 96–112)
Creat: 0.81 mg/dL (ref 0.50–1.35)
Glucose, Bld: 108 mg/dL — ABNORMAL HIGH (ref 70–99)
POTASSIUM: 4.9 meq/L (ref 3.5–5.3)
Sodium: 143 mEq/L (ref 135–145)
Total Protein: 6.7 g/dL (ref 6.0–8.3)

## 2014-02-26 MED ORDER — PRAZIQUANTEL 600 MG PO TABS: 600.0000 mg | ORAL_TABLET | Freq: Every day | ORAL | Status: DC

## 2014-02-26 MED ORDER — TIOTROPIUM BROMIDE MONOHYDRATE 18 MCG IN CAPS: 18.0000 ug | ORAL_CAPSULE | Freq: Every day | RESPIRATORY_TRACT | Status: DC

## 2014-02-26 MED ORDER — MEBENDAZOLE 100 MG PO CHEW: 100.0000 mg | CHEWABLE_TABLET | Freq: Two times a day (BID) | ORAL | Status: DC

## 2014-02-26 MED ORDER — PENICILLIN V POTASSIUM 500 MG PO TABS: 500.0000 mg | ORAL_TABLET | Freq: Three times a day (TID) | ORAL | Status: DC

## 2014-02-26 MED ORDER — ALBENDAZOLE 200 MG PO TABS: 400.0000 mg | ORAL_TABLET | Freq: Once | ORAL | Status: AC

## 2014-02-26 MED ORDER — BENZONATATE 100 MG PO CAPS: 100.0000 mg | ORAL_CAPSULE | Freq: Four times a day (QID) | ORAL | Status: DC | PRN

## 2014-02-26 MED ORDER — PRAZIQUANTEL 600 MG PO TABS: 600.0000 mg | ORAL_TABLET | Freq: Three times a day (TID) | ORAL | Status: DC

## 2014-02-26 NOTE — Assessment & Plan Note (Signed)
Stable on medication which he has prescribed through mental health provider

## 2014-02-26 NOTE — Progress Notes (Signed)
Subjective:    Patient ID: Jimmy Fritz, male    DOB: 06/01/1961, 53 y.o.   MRN: [id number]  HPI Pt in today at the direction of a vet he has been involved with , stating that His stool ([facility]  Tested positive for round worm and dog tapeworm and that he needs to see his PCP for treatment. He sent a note with the information and he also told Pt that the worm infestation could involve many organs including his lungs and that if he treated his illness his breathing may significantly improve very rapidly. Pt is now convinced that his cigarettes are killing him and though he wants to quit will not set a date. States he used spiriva belonging to a friend and wants this , states it seems to be more effective than the symbicort he is maintained on Of note patient should have returned stool samples for testing since the Fall of 2014 as he had the discussion at that time of possible exposure to worms   Review of Systems See HPI Denies recent fever or chills. Denies sinus pressure, nasal congestion, ear pain or sore throat. Denies chest congestion, chronic smokers cough and intermittent wheeze Denies chest pains, palpitations and leg swelling Denies abdominal pain, nausea, vomiting,diarrhea or constipation.   Denies dysuria, frequency, hesitancy or incontinence. Denies joint pain, swelling and limitation in mobility. Denies headaches, seizures, numbness, or tingling. Denies uncontrolled  depression, anxiety or insomnia. Denies skin break down or rash.        Objective:   Physical Exam  BP 118/80  Pulse 91  Resp 16  Wt 157 lb 12.8 oz (71.578 kg)  SpO2 97% Patient alert and oriented and in no cardiopulmonary distress.  HEENT: No facial asymmetry, EOMI, no sinus tenderness,  oropharynx pink and moist.  Neck supple no adenopathy.  Chest: adequate though reduced air entry, crackles in lower lobe  CVS: S1, S2 no murmurs, no S3.  ABD: Soft non tender. Bowel sounds normal.  Ext: No  edema  MS: Adequate ROM spine, shoulders, hips and knees.  Skin: Intact, no ulcerations or rash noted.  Psych: Good eye contact, normal affect. Memory intact not anxious or depressed appearing.  CNS: CN 2-12 intact, power, tone and sensation normal throughout.       Assessment & Plan:  Worm infestation Paperwork and telephone confirmation with vet who knows patient stating that the lab report of worm infected feaces was  indeed that patient and was positive for round worms and dog worms, he also recommended that pt be treated for 3 successive months. 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 He will add daily spiriva to symbicort to assist with his breatrhing Patient counseled for approximately 5 minutes regarding the health risks of ongoing nicotine use, specifically all types of cancer, heart disease, stroke and respiratory failure. The options available for help with cessation ,the behavioral changes to assist the process, and the option to either gradully reduce usage  Or abruptly stop.is also discussed. Pt is also encouraged to set specific goals in number of cigarettes used daily, as well as to set a quit date.    Unspecified chronic bronchitis Abnormal CXR with chronic cough 10 day course of penicillin along with decongestants prescribed.Will need  rept CXr in 2 month to ensure clearing  Psychiatric disorder Stable on medication which he has prescribed through mental health provider  LOW BACK PAIN, CHRONIC Unchanged and controlled on current gabapentin dose  GERD Controlled, no change in medication

## 2014-02-26 NOTE — Assessment & Plan Note (Signed)
Unchanged and controlled on current gabapentin dose

## 2014-02-26 NOTE — Assessment & Plan Note (Signed)
Paperwork and telephone confirmation with vet who knows patient stating that the lab report of worm infected feaces was  indeed that patient and was positive for round worms and dog worms, he also recommended that pt be treated for 3 successive months. 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

## 2014-02-26 NOTE — Patient Instructions (Addendum)
F/u in 4.5 months  You need CXr today, and also labwork, CbC and cmp  I n have sent in medication for  The two  Worms, round worm and dog tapeworm, listed on the paper you brought from the vet.   I have discussed with the vet and you will be treated each month for the next 3 months, April, May and June  Stool needs to be left in the lab early in July for test of cure    You are referred for a colonoscopy   You need to return stool specimens to the lab in 4  Months in the containers we provide for testing of cure  Please work on smoking cessation so that you will be healthier  New medication spiriva is sent in to help with breathing, continue symbicort as before

## 2014-02-26 NOTE — Assessment & Plan Note (Addendum)
Reports smoking on average 7 cigarettes per day, want s to quit states itis killing him, unwilling o set a quit date however. He will add daily spiriva to symbicort to assist with his breatrhing Patient counseled for approximately 5 minutes regarding the health risks of ongoing nicotine use, specifically all types of cancer, heart disease, stroke and respiratory failure. The options available for help with cessation ,the behavioral changes to assist the process, and the option to either gradully reduce usage  Or abruptly stop.is also discussed. Pt is also encouraged to set specific goals in number of cigarettes used daily, as well as to set a quit date.

## 2014-02-26 NOTE — Assessment & Plan Note (Addendum)
Abnormal CXR with chronic cough 10 day course of penicillin along with decongestants prescribed.Will need rept CXr in 2 month to ensure clearing

## 2014-02-26 NOTE — Assessment & Plan Note (Signed)
Controlled, no change in medication  

## 2014-02-27 ENCOUNTER — Other Ambulatory Visit: Payer: Self-pay

## 2014-02-27 LAB — HEMOGLOBIN A1C
Hgb A1c MFr Bld: 5.8 % — ABNORMAL HIGH (ref <5.7)
Mean Plasma Glucose: 120 mg/dL — ABNORMAL HIGH (ref <117)

## 2014-03-09 ENCOUNTER — Other Ambulatory Visit: Payer: Self-pay

## 2014-03-09 DIAGNOSIS — J42 Unspecified chronic bronchitis: Secondary | ICD-10-CM

## 2014-03-09 MED ORDER — BENZONATATE 100 MG PO CAPS: 100.0000 mg | ORAL_CAPSULE | Freq: Four times a day (QID) | ORAL | Status: DC | PRN

## 2014-03-23 ENCOUNTER — Ambulatory Visit: Payer: Medicare Other | Admitting: Family Medicine

## 2014-04-01 ENCOUNTER — Ambulatory Visit: Payer: Medicare Other | Admitting: Gastroenterology

## 2014-06-25 ENCOUNTER — Ambulatory Visit (INDEPENDENT_AMBULATORY_CARE_PROVIDER_SITE_OTHER): Payer: Medicare Other | Admitting: Family Medicine

## 2014-06-25 ENCOUNTER — Encounter: Payer: Self-pay | Admitting: Family Medicine

## 2014-06-25 ENCOUNTER — Encounter (INDEPENDENT_AMBULATORY_CARE_PROVIDER_SITE_OTHER): Payer: Self-pay

## 2014-06-25 VITALS — BP 122/74 | HR 72 | Resp 18 | Ht 70.5 in | Wt 145.0 lb

## 2014-06-25 DIAGNOSIS — F99 Mental disorder, not otherwise specified: Secondary | ICD-10-CM

## 2014-06-25 DIAGNOSIS — F17218 Nicotine dependence, cigarettes, with other nicotine-induced disorders: Secondary | ICD-10-CM

## 2014-06-25 DIAGNOSIS — M545 Low back pain, unspecified: Secondary | ICD-10-CM

## 2014-06-25 DIAGNOSIS — R7302 Impaired glucose tolerance (oral): Secondary | ICD-10-CM

## 2014-06-25 DIAGNOSIS — F17200 Nicotine dependence, unspecified, uncomplicated: Secondary | ICD-10-CM

## 2014-06-25 DIAGNOSIS — R7309 Other abnormal glucose: Secondary | ICD-10-CM

## 2014-06-25 DIAGNOSIS — F19988 Other psychoactive substance use, unspecified with other psychoactive substance-induced disorder: Secondary | ICD-10-CM

## 2014-06-25 DIAGNOSIS — F489 Nonpsychotic mental disorder, unspecified: Secondary | ICD-10-CM

## 2014-06-25 NOTE — Patient Instructions (Signed)
Annual wellness in 4 month, flu vaccine available as of next 2 weeks  HBa1C today.  Good that you had quit smoking, call 1800 QUIT NOW for help to stop again and to stay quit!  Tele # for Dr Blondell RevealJudkins and address, for dental work and also tele # for Dr Rouke's office to schedule an appointment to be seen for the colonoscopy that you need.  Gabapentin is refilled for 6 month

## 2014-06-27 DIAGNOSIS — R7302 Impaired glucose tolerance (oral): Secondary | ICD-10-CM | POA: Insufficient documentation

## 2014-06-27 NOTE — Assessment & Plan Note (Signed)
Reports having quit for 7 weeks , then resumed . Wants to quit and will work on this again Patient counseled for approximately 5 minutes regarding the health risks of ongoing nicotine use, specifically all types of cancer, heart disease, stroke and respiratory failure. The options available for help with cessation ,the behavioral changes to assist the process, and the option to either gradully reduce usage  Or abruptly stop.is also discussed. Pt is also encouraged to set specific goals in number of cigarettes used daily, as well as to set a quit date.

## 2014-06-27 NOTE — Assessment & Plan Note (Signed)
Patient educated about the importance of limiting  Carbohydrate intake , the need to commit to daily physical activity for a minimum of 30 minutes , and to commit weight loss. The fact that changes in all these areas will reduce or eliminate all together the development of diabetes is stressed.   Updated lab needed at/ before next visit.  

## 2014-06-27 NOTE — Assessment & Plan Note (Signed)
Stable, treated by psychiatry 

## 2014-06-27 NOTE — Assessment & Plan Note (Signed)
Good control with gabapentin , continue same
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
## 2014-11-04 ENCOUNTER — Encounter: Payer: Medicare Other | Admitting: Family Medicine

## 2015-01-27 ENCOUNTER — Ambulatory Visit (INDEPENDENT_AMBULATORY_CARE_PROVIDER_SITE_OTHER): Payer: Medicare Other | Admitting: Family Medicine

## 2015-01-27 ENCOUNTER — Encounter: Payer: Self-pay | Admitting: Family Medicine

## 2015-01-27 VITALS — BP 126/74 | HR 100 | Resp 18 | Ht 67.5 in | Wt 150.1 lb

## 2015-01-27 DIAGNOSIS — Z0000 Encounter for general adult medical examination without abnormal findings: Secondary | ICD-10-CM | POA: Diagnosis not present

## 2015-01-27 DIAGNOSIS — R5383 Other fatigue: Secondary | ICD-10-CM | POA: Diagnosis not present

## 2015-01-27 DIAGNOSIS — Z125 Encounter for screening for malignant neoplasm of prostate: Secondary | ICD-10-CM

## 2015-01-27 DIAGNOSIS — R7301 Impaired fasting glucose: Secondary | ICD-10-CM | POA: Diagnosis not present

## 2015-01-27 DIAGNOSIS — F17208 Nicotine dependence, unspecified, with other nicotine-induced disorders: Secondary | ICD-10-CM

## 2015-01-27 LAB — CBC WITH DIFFERENTIAL/PLATELET
Basophils Absolute: 0 10*3/uL (ref 0.0–0.1)
Basophils Relative: 0 % (ref 0–1)
Eosinophils Absolute: 0.1 10*3/uL (ref 0.0–0.7)
Eosinophils Relative: 1 % (ref 0–5)
HEMATOCRIT: 46.4 % (ref 39.0–52.0)
Hemoglobin: 15.5 g/dL (ref 13.0–17.0)
Lymphocytes Relative: 35 % (ref 12–46)
Lymphs Abs: 3.5 10*3/uL (ref 0.7–4.0)
MCH: 30.6 pg (ref 26.0–34.0)
MCHC: 33.4 g/dL (ref 30.0–36.0)
MCV: 91.7 fL (ref 78.0–100.0)
MONOS PCT: 4 % (ref 3–12)
MPV: 10.5 fL (ref 8.6–12.4)
Monocytes Absolute: 0.4 10*3/uL (ref 0.1–1.0)
NEUTROS PCT: 60 % (ref 43–77)
Neutro Abs: 6.1 10*3/uL (ref 1.7–7.7)
Platelets: 233 10*3/uL (ref 150–400)
RBC: 5.06 MIL/uL (ref 4.22–5.81)
RDW: 13.3 % (ref 11.5–15.5)
WBC: 10.1 10*3/uL (ref 4.0–10.5)

## 2015-01-27 NOTE — Patient Instructions (Signed)
Annual physical exam in 5 month, call if you need me before  Labs today, CBC, chem 7, HBA1C , TSH, PSA, Vit D   You can take extra strength tylenol 500 mg one daily if your back hurts excessively  PLS continue to work on cutting back on cigarettEs so that you breathe better and get healthier, you are now aT 6 PER DAY, GREAT  We will send in the spiriva for you to help breathing  You need your colonscopy, PLS GET THIS

## 2015-01-27 NOTE — Assessment & Plan Note (Signed)
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
## 2015-01-28 ENCOUNTER — Other Ambulatory Visit: Payer: Self-pay | Admitting: Family Medicine

## 2015-01-28 DIAGNOSIS — R972 Elevated prostate specific antigen [PSA]: Secondary | ICD-10-CM

## 2015-01-28 LAB — BASIC METABOLIC PANEL
BUN: 11 mg/dL (ref 6–23)
CHLORIDE: 101 meq/L (ref 96–112)
CO2: 22 meq/L (ref 19–32)
CREATININE: 0.77 mg/dL (ref 0.50–1.35)
Calcium: 9.3 mg/dL (ref 8.4–10.5)
Glucose, Bld: 72 mg/dL (ref 70–99)
Potassium: 4.2 mEq/L (ref 3.5–5.3)
SODIUM: 138 meq/L (ref 135–145)

## 2015-01-28 LAB — TSH: TSH: 1.156 u[IU]/mL (ref 0.350–4.500)

## 2015-01-28 LAB — HEMOGLOBIN A1C
HEMOGLOBIN A1C: 5.9 % — AB (ref <5.7)
Mean Plasma Glucose: 123 mg/dL — ABNORMAL HIGH (ref <117)

## 2015-01-28 LAB — PSA: PSA: 12.67 ng/mL — AB (ref <=4.00)

## 2015-01-28 LAB — VITAMIN D 25 HYDROXY (VIT D DEFICIENCY, FRACTURES): Vit D, 25-Hydroxy: 22 ng/mL — ABNORMAL LOW (ref 30–100)

## 2015-01-29 ENCOUNTER — Other Ambulatory Visit: Payer: Self-pay

## 2015-01-29 MED ORDER — VITAMIN D (ERGOCALCIFEROL) 1.25 MG (50000 UNIT) PO CAPS: 50000.0000 [IU] | ORAL_CAPSULE | ORAL | Status: DC

## 2015-03-24 ENCOUNTER — Other Ambulatory Visit: Payer: Self-pay

## 2015-04-30 ENCOUNTER — Other Ambulatory Visit: Payer: Self-pay | Admitting: Urology

## 2015-04-30 ENCOUNTER — Ambulatory Visit (INDEPENDENT_AMBULATORY_CARE_PROVIDER_SITE_OTHER): Payer: Medicare Other | Admitting: Urology

## 2015-04-30 DIAGNOSIS — R972 Elevated prostate specific antigen [PSA]: Secondary | ICD-10-CM

## 2015-05-26 ENCOUNTER — Other Ambulatory Visit: Payer: Self-pay | Admitting: Urology

## 2015-05-26 DIAGNOSIS — R972 Elevated prostate specific antigen [PSA]: Secondary | ICD-10-CM

## 2015-05-28 ENCOUNTER — Ambulatory Visit (HOSPITAL_COMMUNITY)
Admission: RE | Admit: 2015-05-28 | Discharge: 2015-05-28 | Disposition: A | Discharge status: Home / Self Care | Payer: Medicare Other | Source: Ambulatory Visit | Attending: Urology | Admitting: Urology

## 2015-05-28 DIAGNOSIS — R972 Elevated prostate specific antigen [PSA]: Secondary | ICD-10-CM

## 2015-05-28 MED ORDER — LIDOCAINE HCL (PF) 2 % IJ SOLN: 10.0000 mL | Freq: Once | INTRAMUSCULAR | Status: DC

## 2015-05-28 MED ORDER — CEFTRIAXONE SODIUM 1 G IJ SOLR: 1.0000 g | Freq: Once | INTRAMUSCULAR | Status: DC

## 2015-05-28 NOTE — Discharge Instructions (Signed)
Transrectal Ultrasound-Guided Biopsy °A transrectal ultrasound-guided biopsy is a procedure to remove samples of tissue from your prostate using ultrasound images to guide the procedure. The procedure is usually done to evaluate the prostate gland of men who have an elevated prostate-specific antigen (PSA). PSA is a blood test to screen for prostate cancer. The biopsy samples are taken to check for prostate cancer.  °LET YOUR HEALTH CARE PROVIDER KNOW ABOUT: °· Any allergies you have. °· All medicines you are taking, including vitamins, herbs, eye drops, creams, and over-the-counter medicines. °· Previous problems you or members of your family have had with the use of anesthetics. °· Any blood disorders you have. °· Previous surgeries you have had. °· Medical conditions you have. °RISKS AND COMPLICATIONS °Generally, this is a safe procedure. However, as with any procedure, problems can occur. Possible problems include: °· Infection of your prostate. °· Bleeding from your rectum or blood in your urine. °· Difficulty urinating. °· Nerve damage (this is usually temporary). °· Damage to surrounding structures such as blood vessels, organs, and muscles, which would require other procedures. °BEFORE THE PROCEDURE °· Do not eat or drink anything after midnight on the night before the procedure or as directed by your health care provider. °· Take medicines only as directed by your health care provider. °· Your health care provider may have you stop taking certain medicines 5-7 days before the procedure. °· You will be given an enema before the procedure. During an enema, a liquid is injected into your rectum to clear out waste. °· You may have lab tests the day of your procedure.   °· Plan to have someone take you home after the procedure. °PROCEDURE  °· You will be given medicine to help you relax (sedative) before the procedure. An IV tube will be inserted into one of your veins and used to give fluids and  medicine. °· You will be given antibiotic medicine to reduce the risk of an infection. °· You will be placed on your side for the procedure. °· A probe with lubricated gel will be placed into your rectum, and images will be taken of your prostate and surrounding structures. °· Numbing medicine will be injected into the prostate before the biopsy samples are taken. °· A biopsy needle will then be inserted and guided to your prostate with the use of the ultrasound images. °· Samples of prostate tissue will be taken, and the needle will then be removed. °· The biopsy samples will be sent to a lab to be analyzed. Results are usually back in 2-3 days. °AFTER THE PROCEDURE °· You will be taken to a recovery area where you will be monitored. °· You may have some discomfort in the rectal area. You will be given pain medicines to control this. °· You may be allowed to go home the same day, or you may need to stay in the hospital overnight. °Document Released: 03/23/2014 Document Reviewed: 06/25/2013 °ExitCare® Patient Information ©2015 [employer]. This information is not intended to replace advice given to you by your health care provider. Make sure you discuss any questions you have with your health care provider. ° °

## 2015-06-29 ENCOUNTER — Ambulatory Visit (INDEPENDENT_AMBULATORY_CARE_PROVIDER_SITE_OTHER): Payer: Medicare Other | Admitting: Family Medicine

## 2015-06-29 ENCOUNTER — Encounter: Payer: Self-pay | Admitting: Family Medicine

## 2015-06-29 VITALS — BP 118/76 | HR 84 | Resp 16 | Ht 70.0 in | Wt 144.1 lb

## 2015-06-29 DIAGNOSIS — Z1321 Encounter for screening for nutritional disorder: Secondary | ICD-10-CM

## 2015-06-29 DIAGNOSIS — E559 Vitamin D deficiency, unspecified: Secondary | ICD-10-CM

## 2015-06-29 DIAGNOSIS — Z0000 Encounter for general adult medical examination without abnormal findings: Secondary | ICD-10-CM | POA: Diagnosis not present

## 2015-06-29 DIAGNOSIS — R972 Elevated prostate specific antigen [PSA]: Secondary | ICD-10-CM

## 2015-06-29 DIAGNOSIS — F17210 Nicotine dependence, cigarettes, uncomplicated: Secondary | ICD-10-CM | POA: Diagnosis not present

## 2015-06-29 DIAGNOSIS — Z1211 Encounter for screening for malignant neoplasm of colon: Secondary | ICD-10-CM

## 2015-06-29 DIAGNOSIS — F17218 Nicotine dependence, cigarettes, with other nicotine-induced disorders: Secondary | ICD-10-CM

## 2015-06-29 DIAGNOSIS — Z13220 Encounter for screening for lipoid disorders: Secondary | ICD-10-CM

## 2015-06-29 DIAGNOSIS — R7302 Impaired glucose tolerance (oral): Secondary | ICD-10-CM

## 2015-06-29 NOTE — Patient Instructions (Signed)
F/u in 4 months, call if you need me before  Work on STOPPING cigarettes , too costly ion the pocket and your health  PLEASE ensure you DO GET the colonoscopy THIS YEAR, I have skipped the rectal since you do not want it and say that you will do this  Keep appt with your urologist!  Eat greens, not too many sugar buns please  Try to take care of  your dental needs  Meds will be refilled  Fasting lipid, HBA1C and vit D in January  Call for flu vaccine in September

## 2015-06-29 NOTE — Assessment & Plan Note (Signed)

## 2015-07-03 DIAGNOSIS — R972 Elevated prostate specific antigen [PSA]: Secondary | ICD-10-CM | POA: Insufficient documentation

## 2015-07-03 DIAGNOSIS — E559 Vitamin D deficiency, unspecified: Secondary | ICD-10-CM | POA: Insufficient documentation

## 2015-07-03 NOTE — Assessment & Plan Note (Signed)

## 2015-07-03 NOTE — Progress Notes (Signed)
   Subjective:    Patient ID: Jimmy Fritz, male    DOB: 1961/06/10, 54 y.o.   MRN: [id number]  HPI Patient is in for annual physical exam. No other health concerns are expressed or addressed at the visit. Recent labs, if available are reviewed. Immunization is reviewed , and  updated if needed.    Review of Systems See HPI     Objective:   Physical Exam BP 118/76 mmHg  Pulse 84  Resp 16  Ht  (1.778 m)  Wt 144 lb 1.9 oz (65.372 kg)  BMI 20.68 kg/m2  SpO2 98%  Pleasant well nourished male, alert and oriented x 3, in no cardio-pulmonary distress. Afebrile. HEENT No facial trauma or asymetry. Sinuses non tender. EOMI, PERTL, fundoscopic exam is negative for hemorhages or exudates. External ears normal, tympanic membranes clear. Oropharynx moist, no exudate, very poor dentition. Neck: supple, no adenopathy,JVD or thyromegaly.No bruits.  Chest: Clear to ascultation bilaterally.No crackles or wheezes. Decfrased though adequate air entry Non tender to palpation  Breast: No asymetry,no masses. No nipple discharge or inversion. No axillary or supraclavicular adenopathy  Cardiovascular system; Heart sounds normal,  S1 and  S2 ,no S3.  No murmur, or thrill. Apical beat not displaced Peripheral pulses normal.  Abdomen: Soft, non tender, no organomegaly or masses. No bruits. Bowel sounds normal. No guarding, tenderness or rebound.  Rectal:  Deferred, due to patient refusal, also promises to follow through and obtain his colonoscopy whichj is past due, states he will get thsi before the year end  GU: Not examined, sees urology due to recent markedly elevated PSA  Musculoskeletal exam: Full ROM of spine, hips , shoulders and knees. No deformity ,swelling or crepitus noted. No muscle wasting or atrophy.   Neurologic: Cranial nerves 2 to 12 intact. Power, tone ,sensation and reflexes normal throughout. No disturbance in gait. No tremor.  Skin: Intact,  no ulceration, erythema , scaling or rash noted. Pigmentation normal throughout  Psych; Normal mood and affect. Judgement and concentration normal         Assessment & Plan:  Annual physical exam Annual exam as documented. Counseling done  re healthy lifestyle involving commitment to 150 minutes exercise per week, heart healthy diet, and attaining healthy weight.The importance of adequate sleep also discussed. Regular seat belt use and home safety, is also discussed. Changes in health habits are decided on by the patient with goals and time frames  set for achieving them. Immunization and cancer screening needs are specifically addressed at this visit.   Nicotine dependence Patient counseled for approximately 5 minutes regarding the health risks of ongoing nicotine use, specifically all types of cancer, heart disease, stroke and respiratory failure. The options available for help with cessation ,the behavioral changes to assist the process, and the option to either gradully reduce usage  Or abruptly stop.is also discussed. Pt is also encouraged to set specific goals in number of cigarettes used daily, as well as to set a quit date.  Number of cigarettes/cigars currently smoking daily: 12

## 2015-07-22 ENCOUNTER — Other Ambulatory Visit: Payer: Self-pay | Admitting: Family Medicine

## 2015-08-06 ENCOUNTER — Ambulatory Visit: Payer: Medicare Other | Admitting: Urology

## 2015-08-10 ENCOUNTER — Ambulatory Visit: Payer: Self-pay | Admitting: Urology

## 2015-11-01 ENCOUNTER — Encounter: Payer: Self-pay | Admitting: Family Medicine

## 2015-11-01 ENCOUNTER — Ambulatory Visit (INDEPENDENT_AMBULATORY_CARE_PROVIDER_SITE_OTHER): Payer: Medicare Other | Admitting: Family Medicine

## 2015-11-01 VITALS — BP 118/78 | HR 84 | Temp 98.1°F | Resp 16 | Ht 70.0 in | Wt 151.1 lb

## 2015-11-01 DIAGNOSIS — E559 Vitamin D deficiency, unspecified: Secondary | ICD-10-CM

## 2015-11-01 DIAGNOSIS — M5440 Lumbago with sciatica, unspecified side: Secondary | ICD-10-CM

## 2015-11-01 DIAGNOSIS — Z13220 Encounter for screening for lipoid disorders: Secondary | ICD-10-CM | POA: Diagnosis not present

## 2015-11-01 DIAGNOSIS — R7302 Impaired glucose tolerance (oral): Secondary | ICD-10-CM

## 2015-11-01 DIAGNOSIS — G8929 Other chronic pain: Secondary | ICD-10-CM

## 2015-11-01 DIAGNOSIS — F99 Mental disorder, not otherwise specified: Secondary | ICD-10-CM

## 2015-11-01 DIAGNOSIS — F17211 Nicotine dependence, cigarettes, in remission: Secondary | ICD-10-CM

## 2015-11-01 DIAGNOSIS — Z1159 Encounter for screening for other viral diseases: Secondary | ICD-10-CM

## 2015-11-01 DIAGNOSIS — B009 Herpesviral infection, unspecified: Secondary | ICD-10-CM

## 2015-11-01 NOTE — Patient Instructions (Addendum)
Annual wellness in 4 months, call i you need me sooner  Please schedule and get colonoscopy done you need this  Labs are PAST DUE, please het them today!  Resume aspirin 81 mg daily to reduce risk of heart  Disease  CONGRATS on stopping smoking, continue to stay oFF of cigarettes  Thanks for choosing [facility], we consider it a privelige to serve you. All the bestfor 2017!  For cough robitussin is fine , you have no fever or infection

## 2015-11-01 NOTE — Progress Notes (Signed)
   Subjective:    Patient ID: Jimmy Fritz, male    DOB: 03/05/1961, 54 y.o.   MRN: [id number]  HPI   Jimmy Fritz     MRN: [id number]      DOB: 11/11/1961   HPI Jimmy Fritz is here for follow up and re-evaluation of chronic medical conditions, medication management and review of any available recent lab and radiology data.  Preventive health is updated, specifically  Cancer screening and Immunization.   Questions or concerns regarding consultations or procedures which the PT has had in the interim are  addressed. The PT denies any adverse reactions to current medications since the last visit.  C/o cough x 3 weeks , no sputum, fever or chills, remains nicotine free   ROS Denies recent fever or chills. Denies sinus pressure, nasal congestion, ear pain or sore throat. Denies chest congestion, productive cough or wheezing. Denies chest pains, palpitations and leg swelling Denies abdominal pain, nausea, vomiting,diarrhea or constipation.   Denies dysuria, frequency, hesitancy or incontinence. Denies joint pain, swelling and limitation in mobility. Denies headaches, seizures, numbness, or tingling. Denies depression, anxiety or insomnia. Denies skin break down or rash.   PE  BP 118/78 mmHg  Pulse 84  Temp(Src) 98.1 F (36.7 C) (Oral)  Resp 16  Ht 5\' 10"  (1.778 m)  Wt 151 lb 1.9 oz (68.548 kg)  BMI 21.68 kg/m2  SpO2 98%  Patient alert and oriented and in no cardiopulmonary distress.  HEENT: No facial asymmetry, EOMI,   oropharynx pink and moist.  Neck supple no JVD, no mass.  Chest: Clear to auscultation bilaterally.  CVS: S1, S2 no murmurs, no S3.Regular rate.  ABD: Soft non tender.   Ext: No edema  MS: Adequate ROM spine, shoulders, hips and knees.  Skin: Intact, no ulcerations or rash noted.  Psych: Good eye contact, normal affect. Memory intact not anxious or depressed appearing.  CNS: CN 2-12 intact, power,  normal throughout.no focal deficits  noted.   Assessment & Plan   LOW BACK PAIN, CHRONIC Controlled with gabapentin continue same  Psychiatric disorder Stable and treated by psychiatry  Western blot positive HSV2 No current or recent  flare , on no aily antiviral  Vitamin D deficiency Strongly encouraged to take vit D supplement due to low levels      Review of Systems     Objective:   Physical Exam        Assessment & Plan:

## 2016-01-01 NOTE — Assessment & Plan Note (Addendum)
No current or recent  flare , on no aily antiviral

## 2016-01-01 NOTE — Assessment & Plan Note (Signed)
Stable and treated by psychiatry 

## 2016-01-01 NOTE — Assessment & Plan Note (Signed)
Strongly encouraged to take vit D supplement due to low levels

## 2016-01-01 NOTE — Assessment & Plan Note (Signed)
Controlled with gabapentin continue same 

## 2016-02-24 DIAGNOSIS — F17203 Nicotine dependence unspecified, with withdrawal: Secondary | ICD-10-CM | POA: Diagnosis not present

## 2016-02-24 DIAGNOSIS — F410 Panic disorder [episodic paroxysmal anxiety] without agoraphobia: Secondary | ICD-10-CM | POA: Diagnosis not present

## 2016-02-24 DIAGNOSIS — F329 Major depressive disorder, single episode, unspecified: Secondary | ICD-10-CM | POA: Diagnosis not present

## 2016-02-24 DIAGNOSIS — F909 Attention-deficit hyperactivity disorder, unspecified type: Secondary | ICD-10-CM | POA: Diagnosis not present

## 2016-02-24 DIAGNOSIS — Z79899 Other long term (current) drug therapy: Secondary | ICD-10-CM | POA: Diagnosis not present

## 2016-03-01 ENCOUNTER — Encounter: Payer: Self-pay | Admitting: Family Medicine

## 2016-03-01 ENCOUNTER — Ambulatory Visit (INDEPENDENT_AMBULATORY_CARE_PROVIDER_SITE_OTHER): Payer: Medicare Other | Admitting: Family Medicine

## 2016-03-01 VITALS — BP 118/80 | HR 75 | Resp 16 | Ht 70.0 in | Wt 152.0 lb

## 2016-03-01 DIAGNOSIS — Z0000 Encounter for general adult medical examination without abnormal findings: Secondary | ICD-10-CM | POA: Insufficient documentation

## 2016-03-01 DIAGNOSIS — Z1159 Encounter for screening for other viral diseases: Secondary | ICD-10-CM

## 2016-03-01 DIAGNOSIS — R972 Elevated prostate specific antigen [PSA]: Secondary | ICD-10-CM

## 2016-03-01 DIAGNOSIS — E559 Vitamin D deficiency, unspecified: Secondary | ICD-10-CM | POA: Diagnosis not present

## 2016-03-01 DIAGNOSIS — R7302 Impaired glucose tolerance (oral): Secondary | ICD-10-CM

## 2016-03-01 LAB — CBC
HCT: 47.2 % (ref 38.5–50.0)
HEMOGLOBIN: 16.4 g/dL (ref 13.2–17.1)
MCH: 31.4 pg (ref 27.0–33.0)
MCHC: 34.7 g/dL (ref 32.0–36.0)
MCV: 90.4 fL (ref 80.0–100.0)
MPV: 10.7 fL (ref 7.5–12.5)
Platelets: 236 10*3/uL (ref 140–400)
RBC: 5.22 MIL/uL (ref 4.20–5.80)
RDW: 13.5 % (ref 11.0–15.0)
WBC: 13.2 10*3/uL — ABNORMAL HIGH (ref 3.8–10.8)

## 2016-03-01 NOTE — Progress Notes (Signed)
Subjective:    Patient ID: Jimmy Fritz, male    DOB: 11/30/1960, 55 y.o.   MRN: [id number]  HPI Preventive Screening-Counseling & Management   Patient present here today for a Medicare annual wellness visit.   Current Problems (verified)   Medications Prior to Visit Allergies (verified)   PAST HISTORY  Family History (verified)   Social History Single, 1 son, disabled since 2006, former smoker    Risk Factors  Current exercise habits:  None currently due to rod in right leg. Will try to get back into walking a few days a week   Dietary issues discussed: encouraged to limit fried fatty foods and carbs    Cardiac risk factors: none significant  Depression Screen  (Note: if answer to either of the following is "Yes", a more complete depression screening is indicated)   Over the past two weeks, have you felt down, depressed or hopeless? No  Over the past two weeks, have you felt little interest or pleasure in doing things? No  Have you lost interest or pleasure in daily life? No  Do you often feel hopeless? No  Do you cry easily over simple problems? No   Activities of Daily Living  In your present state of health, do you have any difficulty performing the following activities?  Driving?: No Managing money?: No Feeding yourself?:No Getting from bed to chair?:No Climbing a flight of stairs?: has to take his time  Preparing food and eating?:No Bathing or showering?:No Getting dressed?:No Getting to the toilet?:No Using the toilet?:No Moving around from place to place?: No  Fall Risk Assessment In the past year have you fallen or had a near fall?: 1-slipped on ice  Are you currently taking any medications that make you dizziy?:No   Hearing Difficulties: No Do you often ask people to speak up or repeat themselves?:No Do you experience ringing or noises in your ears?:No Do you have difficulty understanding soft or whispered voices?:No  Cognitive Testing    Alert? Yes Normal Appearance?Yes  Oriented to person? Yes Place? Yes  Time? Yes  Displays appropriate judgment?Yes  Can read the correct time from a watch face? yes Are you having problems remembering things?No  Advanced Directives have been discussed with the patient?Yes, brochure given     List the Names of Other Physician/Practitioners you currently use:  [place]   Indicate any recent Medical Services you may have received from other than [facility] providers in the past year (date may be approximate).   Assessment:    Annual Wellness Exam   Plan:    .  Medicare Attestation  I have personally reviewed:  The patient's medical and social history  Their use of alcohol, tobacco or illicit drugs  Their current medications and supplements  The patient's functional ability including ADLs,fall risks, home safety risks, cognitive, and hearing and visual impairment  Diet and physical activities  Evidence for depression or mood disorders  The patient's weight, height, BMI, and visual acuity have been recorded in the chart. I have made referrals, counseling, and provided education to the patient based on review of the above and I have provided the patient with a written personalized care plan for preventive services.      Review of Systems     Objective:   Physical Exam  BP 118/80 mmHg  Pulse 75  Resp 16  Ht 5\' 10"  (1.778 m)  Wt 152 lb (68.947 kg)  BMI 21.81 kg/m2  SpO2 98%  Assessment & Plan:  Medicare annual wellness visit, subsequent Annual exam as documented. Counseling done  re healthy lifestyle involving commitment to 150 minutes exercise per week, heart healthy diet, and attaining healthy weight.The importance of adequate sleep also discussed. Regular seat belt use and home safety, is also discussed. Changes in health habits are decided on by the patient with goals and time frames  set for achieving them. Immunization and cancer screening  needs are specifically addressed at this visit.

## 2016-03-01 NOTE — Assessment & Plan Note (Signed)

## 2016-03-01 NOTE — Patient Instructions (Signed)
Annual physical exam in October, call if you need me before  CBC, cmp, hBA1C, PSA, TSH, VitD, LDL today and Hep C   No med change  I have referred you to Dr Annabell HowellsWrenn pls keep appt as we discussed Thank you  for choosing [facility]. We consider it a privelige to serve you.  Delivering excellent health care in a caring and  compassionate way is our goal.  Partnering with you,  so that together we can achieve this goal is our strategy.

## 2016-03-02 LAB — LDL CHOLESTEROL, DIRECT: LDL DIRECT: 100 mg/dL (ref <130)

## 2016-03-02 LAB — COMPREHENSIVE METABOLIC PANEL
ALBUMIN: 4.6 g/dL (ref 3.6–5.1)
ALT: 18 U/L (ref 9–46)
AST: 17 U/L (ref 10–35)
Alkaline Phosphatase: 81 U/L (ref 40–115)
BUN: 14 mg/dL (ref 7–25)
CALCIUM: 9.7 mg/dL (ref 8.6–10.3)
CHLORIDE: 101 mmol/L (ref 98–110)
CO2: 24 mmol/L (ref 20–31)
CREATININE: 0.86 mg/dL (ref 0.70–1.33)
Glucose, Bld: 103 mg/dL — ABNORMAL HIGH (ref 65–99)
POTASSIUM: 4.3 mmol/L (ref 3.5–5.3)
SODIUM: 136 mmol/L (ref 135–146)
TOTAL PROTEIN: 7.7 g/dL (ref 6.1–8.1)
Total Bilirubin: 0.6 mg/dL (ref 0.2–1.2)

## 2016-03-02 LAB — HEPATITIS C ANTIBODY: HCV Ab: NEGATIVE

## 2016-03-02 LAB — HEMOGLOBIN A1C
HEMOGLOBIN A1C: 5.9 % — AB (ref <5.7)
MEAN PLASMA GLUCOSE: 123 mg/dL

## 2016-03-02 LAB — TSH: TSH: 0.99 mIU/L (ref 0.40–4.50)

## 2016-03-02 LAB — VITAMIN D 25 HYDROXY (VIT D DEFICIENCY, FRACTURES): Vit D, 25-Hydroxy: 33 ng/mL (ref 30–100)

## 2016-03-02 LAB — PSA, MEDICARE: PSA: 3.89 ng/mL (ref <=4.00)

## 2016-03-31 ENCOUNTER — Ambulatory Visit (INDEPENDENT_AMBULATORY_CARE_PROVIDER_SITE_OTHER): Payer: Medicare Other | Admitting: Urology

## 2016-03-31 ENCOUNTER — Other Ambulatory Visit: Payer: Self-pay | Admitting: Urology

## 2016-03-31 DIAGNOSIS — R972 Elevated prostate specific antigen [PSA]: Secondary | ICD-10-CM

## 2016-03-31 DIAGNOSIS — N402 Nodular prostate without lower urinary tract symptoms: Secondary | ICD-10-CM | POA: Diagnosis not present

## 2016-05-05 ENCOUNTER — Encounter (HOSPITAL_COMMUNITY): Payer: Self-pay

## 2016-05-05 ENCOUNTER — Ambulatory Visit (HOSPITAL_COMMUNITY)
Admission: RE | Admit: 2016-05-05 | Discharge: 2016-05-05 | Disposition: A | Discharge status: Home / Self Care | Payer: Medicare Other | Source: Ambulatory Visit | Attending: Urology | Admitting: Urology

## 2016-05-05 VITALS — BP 133/94 | HR 106 | Temp 97.8°F | Resp 20

## 2016-05-05 DIAGNOSIS — R972 Elevated prostate specific antigen [PSA]: Secondary | ICD-10-CM | POA: Diagnosis not present

## 2016-05-05 DIAGNOSIS — N402 Nodular prostate without lower urinary tract symptoms: Secondary | ICD-10-CM | POA: Diagnosis not present

## 2016-05-05 MED ORDER — CEFTRIAXONE SODIUM 1 G IJ SOLR
1.0000 g | Freq: Once | INTRAMUSCULAR | Status: AC
  Administered 2016-05-05: 1 g via INTRAMUSCULAR

## 2016-05-05 MED ORDER — CEFTRIAXONE SODIUM 1 G IJ SOLR
INTRAMUSCULAR | Status: AC
  Administered 2016-05-05: 1 g via INTRAMUSCULAR
  Filled 2016-05-05: qty 10

## 2016-05-05 MED ORDER — LIDOCAINE HCL (PF) 2 % IJ SOLN
10.0000 mL | Freq: Once | INTRAMUSCULAR | Status: AC
  Administered 2016-05-05: 10 mL

## 2016-05-05 MED ORDER — LIDOCAINE HCL (PF) 1 % IJ SOLN
INTRAMUSCULAR | Status: AC
  Filled 2016-05-05: qty 5

## 2016-05-05 MED ORDER — LIDOCAINE HCL (PF) 2 % IJ SOLN
INTRAMUSCULAR | Status: AC
  Administered 2016-05-05: 10 mL
  Filled 2016-05-05: qty 10

## 2016-05-05 MED ORDER — LIDOCAINE HCL (PF) 1 % IJ SOLN
INTRAMUSCULAR | Status: AC
  Administered 2016-05-05: 2 mL
  Filled 2016-05-05: qty 5

## 2016-05-05 NOTE — Discharge Instructions (Signed)
Transrectal Ultrasound-Guided Biopsy °A transrectal ultrasound-guided biopsy is a procedure to take samples of tissue from your prostate. Ultrasound images are used to guide the procedure. It is usually done to check the prostate gland for cancer. °BEFORE THE PROCEDURE °· Do not eat or drink after midnight on the night before your procedure. °· Take medicines as your doctor tells you. °· Your doctor may have you stop taking some medicines 5-7 days before the procedure. °· You will be given an enema before your procedure. During an enema, a liquid is put into your butt (rectum) to clear out waste. °· You may have lab tests the day of your procedure. °· Make plans to have someone drive you home. °PROCEDURE °· You will be given medicine to help you relax before the procedure. An IV tube will be put into one of your veins. It will be used to give fluids and medicine. °· You will be given medicine to reduce the risk of infection (antibiotic). °· You will be placed on your side. °· A probe with gel will be put in your butt. This is used to take pictures of your prostate and the area around it. °· A medicine to numb the area is put into your prostate. °· A biopsy needle is then inserted and guided to your prostate. °· Samples of prostate tissue are taken. The needle is removed. °· The samples are sent to a lab to be checked. Results are usually back in 2-3 days. °AFTER THE PROCEDURE °· You will be taken to a room where you will be watched until you are doing okay. °· You may have some pain in the area around your butt. You will be given medicines for this. °· You may be able to go home the same day. Sometimes, an overnight stay in the hospital is needed. °  °This information is not intended to replace advice given to you by your health care provider. Make sure you discuss any questions you have with your health care provider. °  °Document Released: 10/25/2009 Document Revised: 11/11/2013 Document Reviewed:  06/25/2013 °Elsevier Interactive Patient Education ©2016 [employer]. ° °

## 2016-08-31 ENCOUNTER — Encounter: Payer: Self-pay | Admitting: Family Medicine

## 2016-12-25 ENCOUNTER — Ambulatory Visit (INDEPENDENT_AMBULATORY_CARE_PROVIDER_SITE_OTHER): Payer: Medicare Other

## 2016-12-25 VITALS — BP 124/90 | HR 96 | Temp 98.1°F | Ht 70.0 in | Wt 161.0 lb

## 2016-12-25 DIAGNOSIS — Z0000 Encounter for general adult medical examination without abnormal findings: Secondary | ICD-10-CM | POA: Diagnosis not present

## 2016-12-25 MED ORDER — GABAPENTIN 800 MG PO TABS: ORAL_TABLET | ORAL | 2 refills | Status: DC

## 2016-12-25 NOTE — Addendum Note (Signed)
Addended by: Candis ShineHARRISON, KRYSTAL D on: 12/25/2016 03:05 PM   Modules accepted: Orders

## 2016-12-25 NOTE — Patient Instructions (Addendum)
Health maintenance: Due for Flu vaccine and colonoscopy, patient declines. Information on cologuard testing was given to you today. Please call our office once you decide which test you prefer for colon cancer screening.   Abnormal screenings: None   Patient concerns: vision. Vision screening was normal, recommend follow up with Eye Dr. if needed.   Nurse concerns: Quit smoking   Next PCP appt: In 3 months with Dr. Moshe Cipro for your annual physical. Please return in 1 year for your annual wellness visit.  Advance directive discussed with patient today. Copy provided for patient to complete at home and have notarized. Patient agrees to have copy sent to our office once it is complete.  Preventive Care 40-64 Years, Male Preventive care refers to lifestyle choices and visits with your health care provider that can promote health and wellness. What does preventive care include?  A yearly physical exam. This is also called an annual well check.  Dental exams once or twice a year.  Routine eye exams. Ask your health care provider how often you should have your eyes checked.  Personal lifestyle choices, including:  Daily care of your teeth and gums.  Regular physical activity.  Eating a healthy diet.  Avoiding tobacco and drug use.  Limiting alcohol use.  Practicing safe sex.  Taking low-dose aspirin every day starting at age 53. What happens during an annual well check? The services and screenings done by your health care provider during your annual well check will depend on your age, overall health, lifestyle risk factors, and family history of disease. Counseling  Your health care provider may ask you questions about your:  Alcohol use.  Tobacco use.  Drug use.  Emotional well-being.  Home and relationship well-being.  Sexual activity.  Eating habits.  Work and work Statistician. Screening  You may have the following tests or measurements:  Height, weight,  and BMI.  Blood pressure.  Lipid and cholesterol levels. These may be checked every 5 years, or more frequently if you are over 60 years old.  Skin check.  Lung cancer screening. You may have this screening every year starting at age 65 if you have a 30-pack-year history of smoking and currently smoke or have quit within the past 15 years.  Fecal occult blood test (FOBT) of the stool. You may have this test every year starting at age 31.  Flexible sigmoidoscopy or colonoscopy. You may have a sigmoidoscopy every 5 years or a colonoscopy every 10 years starting at age 47.  Prostate cancer screening. Recommendations will vary depending on your family history and other risks.  Hepatitis C blood test.  Hepatitis B blood test.  Sexually transmitted disease (STD) testing.  Diabetes screening. This is done by checking your blood sugar (glucose) after you have not eaten for a while (fasting). You may have this done every 1-3 years. Discuss your test results, treatment options, and if necessary, the need for more tests with your health care provider. Vaccines  Your health care provider may recommend certain vaccines, such as:  Influenza vaccine. This is recommended every year.  Tetanus, diphtheria, and acellular pertussis (Tdap, Td) vaccine. You may need a Td booster every 10 years.  Zoster vaccine. You may need this after age 78.  Measles, mumps, and rubella (MMR) vaccine. You may need at least one dose of MMR if you were born in 1957 or later. You may also need a second dose.  Pneumococcal 13-valent conjugate (PCV13) vaccine. You may need this if you  have certain conditions and have not been vaccinated.  Pneumococcal polysaccharide (PPSV23) vaccine. You may need one or two doses if you smoke cigarettes or if you have certain conditions.  Talk to your health care provider about which screenings and vaccines you need and how often you need them. This information is not intended to  replace advice given to you by your health care provider. Make sure you discuss any questions you have with your health care provider. Document Released: 12/03/2015 Document Revised: 07/26/2016 Document Reviewed: 09/07/2015 Elsevier Interactive Patient Education  2017 [employer].
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
## 2017-03-26 ENCOUNTER — Encounter: Payer: Self-pay | Admitting: Family Medicine

## 2017-03-26 ENCOUNTER — Ambulatory Visit (INDEPENDENT_AMBULATORY_CARE_PROVIDER_SITE_OTHER): Payer: Medicare Other | Admitting: Family Medicine

## 2017-03-26 VITALS — BP 130/80 | HR 88 | Resp 16 | Ht 70.0 in | Wt 160.0 lb

## 2017-03-26 DIAGNOSIS — Z1211 Encounter for screening for malignant neoplasm of colon: Secondary | ICD-10-CM

## 2017-03-26 DIAGNOSIS — Z0000 Encounter for general adult medical examination without abnormal findings: Secondary | ICD-10-CM

## 2017-03-26 DIAGNOSIS — F17200 Nicotine dependence, unspecified, uncomplicated: Secondary | ICD-10-CM

## 2017-03-26 LAB — POC HEMOCCULT BLD/STL (OFFICE/1-CARD/DIAGNOSTIC): Fecal Occult Blood, POC: NEGATIVE

## 2017-03-26 NOTE — Assessment & Plan Note (Signed)
Heme negative stool, no rectal masses, still postponing colonoscopy

## 2017-03-26 NOTE — Assessment & Plan Note (Signed)

## 2017-03-26 NOTE — Progress Notes (Signed)
   Jimmy Fritz     MRN: [id number]      DOB: 12/21/1960   HPI: Patient is in for annual physical exam. No other health concerns are expressed or addressed at the visit. Still resisting colonoscopy, denies change in bowel habits Immunization is reviewed , and  updated if needed.    PE; Pleasant male, alert and oriented x 3, in no cardio-pulmonary distress. Afebrile. HEENT No facial trauma or asymetry. Sinuses non tender. EOMI,  External ears normal, tympanic membranes clear. Oropharynx moist, no exudate. Neck: supple, no adenopathy,JVD or thyromegaly.No bruits.  Chest: Clear to ascultation bilaterally.No crackles or wheezes.Decreased air entry throughout Non tender to palpation  Breast: No asymetry,no masses. No nipple discharge or inversion. No axillary or supraclavicular adenopathy  Cardiovascular system; Heart sounds normal,  S1 and  S2 ,no S3.  No murmur, or thrill. Apical beat not displaced Peripheral pulses normal.  Abdomen: Soft, non tender, no organomegaly or masses. No bruits. Bowel sounds normal. No guarding, tenderness or rebound.  Rectal:  Normal sphincter tone. No hemorrhoids or  masses. guaiac negative stool. Prostate smooth and firm    Musculoskeletal exam: Full ROM of spine, hips , shoulders and knees. No deformity ,swelling or crepitus noted. No muscle wasting or atrophy.   Neurologic: Cranial nerves 2 to 12 intact. Power, tone ,sensation and reflexes normal throughout. No disturbance in gait. No tremor.  Skin: Intact, no ulceration, erythema , scaling or rash noted. Pigmentation normal throughout  Psych; Normal mood and flat  affect.  concentration normal   Assessment & Plan:  Annual physical exam Annual exam as documented. Counseling done  re healthy lifestyle involving commitment to 150 minutes exercise per week, heart healthy diet, and attaining healthy weight.The importance of adequate sleep also discussed. Regular seat  belt use and home safety, is also discussed. Changes in health habits are decided on by the patient with goals and time frames  set for achieving them. Immunization and cancer screening needs are specifically addressed at this visit.   Colon cancer screening Heme negative stool, no rectal masses, still postponing colonoscopy  Tobacco dependency Patient counseled for approximately 5 minutes regarding the health risks of ongoing nicotine use, specifically all types of cancer, heart disease, stroke and respiratory failure. The options available for help with cessation ,the behavioral changes to assist the process, and the option to either gradully reduce usage  Or abruptly stop.is also discussed. Pt is also encouraged to set specific goals in number of cigarettes used daily, as well as to set a quit date.

## 2017-03-26 NOTE — Patient Instructions (Signed)
f/u in 5 month, call if you need me sooner  You still need your colonoscopy  Need fasting  Blood work the day you come back for your next visit, you will get lab order on that day.  Continue to stay away from brandy!  Continue to cut back on smoking, now down to 10/ day  Continue to enjoy music!  Thank you  for choosing [facility]. We consider it a privelige to serve you.  Delivering excellent health care in a caring and  compassionate way is our goal.  Partnering with you,  so that together we can achieve this goal is our strategy.

## 2017-03-26 NOTE — Assessment & Plan Note (Signed)

## 2017-06-25 DIAGNOSIS — Z79899 Other long term (current) drug therapy: Secondary | ICD-10-CM | POA: Diagnosis not present

## 2017-08-27 ENCOUNTER — Ambulatory Visit (INDEPENDENT_AMBULATORY_CARE_PROVIDER_SITE_OTHER): Payer: Medicare Other | Admitting: Family Medicine

## 2017-08-27 ENCOUNTER — Encounter: Payer: Self-pay | Admitting: Family Medicine

## 2017-08-27 VITALS — BP 120/80 | HR 91 | Temp 98.0°F | Resp 16 | Ht 70.0 in | Wt 157.0 lb

## 2017-08-27 DIAGNOSIS — G8929 Other chronic pain: Secondary | ICD-10-CM

## 2017-08-27 DIAGNOSIS — M5440 Lumbago with sciatica, unspecified side: Secondary | ICD-10-CM | POA: Diagnosis not present

## 2017-08-27 DIAGNOSIS — L729 Follicular cyst of the skin and subcutaneous tissue, unspecified: Secondary | ICD-10-CM

## 2017-08-27 DIAGNOSIS — Z23 Encounter for immunization: Secondary | ICD-10-CM

## 2017-08-27 DIAGNOSIS — F17200 Nicotine dependence, unspecified, uncomplicated: Secondary | ICD-10-CM

## 2017-08-27 DIAGNOSIS — L089 Local infection of the skin and subcutaneous tissue, unspecified: Secondary | ICD-10-CM | POA: Diagnosis not present

## 2017-08-27 DIAGNOSIS — F99 Mental disorder, not otherwise specified: Secondary | ICD-10-CM | POA: Diagnosis not present

## 2017-08-27 MED ORDER — GABAPENTIN 800 MG PO TABS: 800.0000 mg | ORAL_TABLET | Freq: Three times a day (TID) | ORAL | 5 refills | Status: DC

## 2017-08-27 MED ORDER — ACETAMINOPHEN 325 MG PO TABS
325.0000 mg | ORAL_TABLET | Freq: Once | ORAL | Status: AC
  Administered 2017-08-27: 325 mg via ORAL

## 2017-08-27 MED ORDER — DOXYCYCLINE HYCLATE 100 MG PO TABS: 100.0000 mg | ORAL_TABLET | Freq: Two times a day (BID) | ORAL | 0 refills | Status: DC

## 2017-08-27 NOTE — Assessment & Plan Note (Addendum)
Managed with gabapentin, refill same, controled

## 2017-08-27 NOTE — Patient Instructions (Addendum)
Wellness visit early March,with nurse , call if you need  me before  Please work on stopping cigarettes , so your health improves  CONGRATS on deciding to take the flu vaccine in your right arm today with 1 tylenol tablet  Always, enjoy each day!  Physical exam wiith MD mid May  Thank you  for choosing [facility]. We consider it a privelige to serve you.  Delivering excellent health care in a caring and  compassionate way is our goal.  Partnering with you,  so that together we can achieve this goal is our strategy.   1 week of antibiotic sent in for cyst at back of neck, you will need to get surgeeon or dermatologist to remove it physically if it is still bothering you, call if you want this done      Steps to Quit Smoking Smoking tobacco can be bad for your health. It can also affect almost every organ in your body. Smoking puts you and people around you at risk for many serious long-lasting (chronic) diseases. Quitting smoking is hard, but it is one of the best things that you can do for your health. It is never too late to quit. What are the benefits of quitting smoking? When you quit smoking, you lower your risk for getting serious diseases and conditions. They can include:  Lung cancer or lung disease.  Heart disease.  Stroke.  Heart attack.  Not being able to have children (infertility).  Weak bones (osteoporosis) and broken bones (fractures).  If you have coughing, wheezing, and shortness of breath, those symptoms may get better when you quit. You may also get sick less often. If you are pregnant, quitting smoking can help to lower your chances of having a baby of low birth weight. What can I do to help me quit smoking? Talk with your doctor about what can help you quit smoking. Some things you can do (strategies) include:  Quitting smoking totally, instead of slowly cutting back how much you smoke over a period of time.  Going to in-person  counseling. You are more likely to quit if you go to many counseling sessions.  Using resources and support systems, such as: ? Agricultural engineer with a Veterinary surgeon. ? Phone quitlines. ? Automotive engineer. ? Support groups or group counseling. ? Text messaging programs. ? Mobile phone apps or applications.  Taking medicines. Some of these medicines may have nicotine in them. If you are pregnant or breastfeeding, do not take any medicines to quit smoking unless your doctor says it is okay. Talk with your doctor about counseling or other things that can help you.  Talk with your doctor about using more than one strategy at the same time, such as taking medicines while you are also going to in-person counseling. This can help make quitting easier. What things can I do to make it easier to quit? Quitting smoking might feel very hard at first, but there is a lot that you can do to make it easier. Take these steps:  Talk to your family and friends. Ask them to support and encourage you.  Call phone quitlines, reach out to support groups, or work with a Veterinary surgeon.  Ask people who smoke to not smoke around you.  Avoid places that make you want (trigger) to smoke, such as: ? Bars. ? Parties. ? Smoke-break areas at work.  Spend time with people who do not smoke.  Lower the stress in your life. Stress can make  you want to smoke. Try these things to help your stress: ? Getting regular exercise. ? Deep-breathing exercises. ? Yoga. ? Meditating. ? Doing a body scan. To do this, close your eyes, focus on one area of your body at a time from head to toe, and notice which parts of your body are tense. Try to relax the muscles in those areas.  Download or buy apps on your mobile phone or tablet that can help you stick to your quit plan. There are many free apps, such as QuitGuide from the [employer] developer for Disease Control and Prevention). You can find more support from smokefree.gov and other  websites.  This information is not intended to replace advice given to you by your health care provider. Make sure you discuss any questions you have with your health care provider. Document Released: 09/02/2009 Document Revised: 07/04/2016 Document Reviewed: 03/23/2015 Elsevier Interactive Patient Education  2018 [employer].

## 2017-08-27 NOTE — Assessment & Plan Note (Signed)

## 2017-08-27 NOTE — Progress Notes (Signed)
   Jimmy Fritz     MRN: [id number]      DOB: 1961-09-23   HPI Jimmy Fritz is here for follow up and re-evaluation of chronic medical conditions, medication management and review of any available recent lab and radiology data.  Preventive health is updated, specifically  Cancer screening and Immunization.   Questions or concerns regarding consultations or procedures which the PT has had in the interim are  addressed. The PT denies any adverse reactions to current medications since the last visit.  C/o painful lesion on posterior neck where he removed tick months ago, no pus draining but nodule still pressent and this concerns him   ROS Denies recent fever or chills. Denies sinus pressure, nasal congestion, ear pain or sore throat. Denies chest congestion, productive cough or wheezing. Denies chest pains, palpitations and leg swelling Denies abdominal pain, nausea, vomiting,diarrhea or constipation.   Denies dysuria, frequency, hesitancy or incontinence. Denies uncontrolled oint pain, swelling and limitation in mobility. Denies headaches, seizures, numbness, or tingling. Denies  Uncontrolled depression, anxiety or insomnia.Managed by psychiatrey    PE  Pulse 91   Temp 98 F (36.7 C) (Other (Comment))   Resp 16   Ht  (1.778 m)   Wt 157 lb (71.2 kg)   SpO2 97%   BMI 22.53 kg/m   Patient alert and oriented and in no cardiopulmonary distress.  HEENT: No facial asymmetry, EOMI,   oropharynx pink and moist.  Neck supple no JVD, no mass.  Chest: Clear to auscultation bilaterally.  CVS: S1, S2 no murmurs, no S3.Regular rate.  ABD: Soft non tender.   Ext: No edema  MS: Adequate ROM spine, shoulders, hips and knees.  Skin: Intact, no ulcerations or rash noted.Nodule present on posterior neck left mildly erythematous and tender  Psych: Good eye contact, normal affect. Memory intact not anxious or depressed appearing.  CNS: CN 2-12 intact, power,  normal  throughout.no focal deficits noted.   Assessment & Plan  LOW BACK PAIN, CHRONIC Managed with gabapentin, refill same, controled  Tobacco dependency Patient is asked and  confirms current  Nicotine use.  Five to seven minutes of time is spent in counseling the patient of the need to quit smoking  Advice to quit is delivered clearly specifically in reducing the risk of developing heart disease, having a stroke, or of developing all types of cancer, especially lung and oral cancer. Improvement in breathing and exercise tolerance and quality of life is also discussed, as is the economic benefit.  Assessment of willingness to quit or to make an attempt to quit is made and documented  Assistance in quit attempt is made with several and varied options presented, based on patient's desire and need. These include  literature, local classes available, 1800 QUIT NOW number, OTC and prescription medication.  The GOAL to be NICOTINE FREE is re emphasized.  The patient has set a personal goal of either reduction or discontinuation and follow up is arranged between 6 an 16 weeks.    Psychiatric disorder And treated by psychiatryKASS HERBERGERt of skin Antibiotic course prescribed, remote h/o tick bite to affected area

## 2017-08-30 ENCOUNTER — Encounter: Payer: Self-pay | Admitting: Family Medicine

## 2017-08-30 DIAGNOSIS — L989 Disorder of the skin and subcutaneous tissue, unspecified: Secondary | ICD-10-CM | POA: Insufficient documentation

## 2017-08-30 DIAGNOSIS — L729 Follicular cyst of the skin and subcutaneous tissue, unspecified: Secondary | ICD-10-CM

## 2017-08-30 DIAGNOSIS — L089 Local infection of the skin and subcutaneous tissue, unspecified: Secondary | ICD-10-CM | POA: Insufficient documentation

## 2017-08-30 NOTE — Assessment & Plan Note (Signed)
Antibiotic course prescribed, remote h/o tick bite to affected area

## 2017-08-30 NOTE — Assessment & Plan Note (Signed)
And treated by psychiatry

## 2017-10-24 ENCOUNTER — Encounter: Payer: Self-pay | Admitting: Family Medicine

## 2018-02-25 ENCOUNTER — Ambulatory Visit: Payer: Self-pay | Admitting: Family Medicine

## 2018-03-12 ENCOUNTER — Ambulatory Visit: Payer: Self-pay | Admitting: Family Medicine

## 2018-06-18 ENCOUNTER — Ambulatory Visit: Payer: Medicare Other | Admitting: Family Medicine

## 2018-06-20 ENCOUNTER — Encounter: Payer: Self-pay | Admitting: Family Medicine

## 2018-06-20 ENCOUNTER — Other Ambulatory Visit: Payer: Self-pay

## 2018-06-20 ENCOUNTER — Ambulatory Visit: Payer: Medicare Other | Admitting: Family Medicine

## 2018-06-20 VITALS — BP 130/80 | HR 94 | Resp 12 | Ht 72.0 in | Wt 157.1 lb

## 2018-06-20 DIAGNOSIS — G8929 Other chronic pain: Secondary | ICD-10-CM

## 2018-06-20 DIAGNOSIS — T63301A Toxic effect of unspecified spider venom, accidental (unintentional), initial encounter: Secondary | ICD-10-CM | POA: Diagnosis not present

## 2018-06-20 DIAGNOSIS — M5440 Lumbago with sciatica, unspecified side: Secondary | ICD-10-CM | POA: Diagnosis not present

## 2018-06-20 DIAGNOSIS — F17200 Nicotine dependence, unspecified, uncomplicated: Secondary | ICD-10-CM | POA: Diagnosis not present

## 2018-06-20 DIAGNOSIS — Z1211 Encounter for screening for malignant neoplasm of colon: Secondary | ICD-10-CM | POA: Diagnosis not present

## 2018-06-20 DIAGNOSIS — F99 Mental disorder, not otherwise specified: Secondary | ICD-10-CM

## 2018-06-20 MED ORDER — DOXYCYCLINE HYCLATE 100 MG PO TABS: 100.0000 mg | ORAL_TABLET | Freq: Two times a day (BID) | ORAL | 0 refills | Status: DC

## 2018-06-20 NOTE — Patient Instructions (Addendum)
Wellness with nurse past due  please schedule  Please schedule annual physical exam with MD propbably the same day as wellness with nurse  10 day course of antibiotics prescribed for infection on back of scalp   We are arranging cologuard   Need to get LABS!!     Steps to Quit Smoking Smoking tobacco can be bad for your health. It can also affect almost every organ in your body. Smoking puts you and people around you at risk for many serious long-lasting (chronic) diseases. Quitting smoking is hard, but it is one of the best things that you can do for your health. It is never too late to quit. What are the benefits of quitting smoking? When you quit smoking, you lower your risk for getting serious diseases and conditions. They can include:  Lung cancer or lung disease.  Heart disease.  Stroke.  Heart attack.  Not being able to have children (infertility).  Weak bones (osteoporosis) and broken bones (fractures).  If you have coughing, wheezing, and shortness of breath, those symptoms may get better when you quit. You may also get sick less often. If you are pregnant, quitting smoking can help to lower your chances of having a baby of low birth weight. What can I do to help me quit smoking? Talk with your doctor about what can help you quit smoking. Some things you can do (strategies) include:  Quitting smoking totally, instead of slowly cutting back how much you smoke over a period of time.  Going to in-person counseling. You are more likely to quit if you go to many counseling sessions.  Using resources and support systems, such as: ? Agricultural engineernline chats with a Veterinary surgeoncounselor. ? Phone quitlines. ? Automotive engineerrinted self-help materials. ? Support groups or group counseling. ? Text messaging programs. ? Mobile phone apps or applications.  Taking medicines. Some of these medicines may have nicotine in them. If you are pregnant or breastfeeding, do not take any medicines to quit smoking unless  your doctor says it is okay. Talk with your doctor about counseling or other things that can help you.  Talk with your doctor about using more than one strategy at the same time, such as taking medicines while you are also going to in-person counseling. This can help make quitting easier. What things can I do to make it easier to quit? Quitting smoking might feel very hard at first, but there is a lot that you can do to make it easier. Take these steps:  Talk to your family and friends. Ask them to support and encourage you.  Call phone quitlines, reach out to support groups, or work with a Veterinary surgeoncounselor.  Ask people who smoke to not smoke around you.  Avoid places that make you want (trigger) to smoke, such as: ? Bars. ? Parties. ? Smoke-break areas at work.  Spend time with people who do not smoke.  Lower the stress in your life. Stress can make you want to smoke. Try these things to help your stress: ? Getting regular exercise. ? Deep-breathing exercises. ? Yoga. ? Meditating. ? Doing a body scan. To do this, close your eyes, focus on one area of your body at a time from head to toe, and notice which parts of your body are tense. Try to relax the muscles in those areas.  Download or buy apps on your mobile phone or tablet that can help you stick to your quit plan. There are many free apps, such as QuitGuide  from the [employer] for Disease Control and Prevention). You can find more support from smokefree.gov and other websites.  This information is not intended to replace advice given to you by your health care provider. Make sure you discuss any questions you have with your health care provider. Document Released: 09/02/2009 Document Revised: 07/04/2016 Document Reviewed: 03/23/2015 Elsevier Interactive Patient Education  2018 [employer].

## 2018-06-20 NOTE — Assessment & Plan Note (Addendum)
1 week ago spider bite with purulent drainage will prescribe doxycycline for 10 days, no  Drainage noted  At visit , although area remains swollen and tender , max diameter a[pproximately  cm

## 2018-06-20 NOTE — Assessment & Plan Note (Addendum)
Asked: confirms Still smokes cigarettes  8 /d ay Assess: unwilling to set quit date Advise needs to quit, to reduce lung disease and cancer, heart disease and stroke Assist: counseled for 5 mins and literature provided Arrrange f/U in 3 to 4 months

## 2018-06-20 NOTE — Progress Notes (Signed)
   Jimmy Fritz     MRN: [id number]      DOB: 10/24/1961   HPI Jimmy Fritz is here for follow up and re-evaluation of chronic medical conditions, medication management and review of any available recent lab and radiology data.  Preventive health is updated, specifically  Cancer screening and Immunization.  Still refuses colonoscopy but will do cologuard Is still followed by Psychiatric and is only prescribed adderall. The PT denies any adverse reactions to current medications since the last visit.  Bit by spider 1 week ago,in back of his scalp, States he had pus draining from the bite after this  . Denies fever, chills or myalgias  ROS Denies recent fever or chills. Denies sinus pressure, nasal congestion, ear pain or sore throat. Denies chest congestion, productive cough or wheezing. Denies chest pains, palpitations and leg swelling Denies abdominal pain, nausea, vomiting,diarrhea or constipation.   Denies dysuria, frequency, hesitancy or incontinence. Denies joint pain, swelling and limitation in mobility. Denies headaches, seizures, numbness, or tingling. Denies depression, anxiety or insomnia.  PE  BP 130/80   Pulse 94   Resp 12   Ht 6' (1.829 m)   Wt 157 lb 1.3 oz (71.3 kg)   SpO2 98%   BMI 21.30 kg/m   Patient alert and oriented and in no cardiopulmonary distress.  HEENT: No facial asymmetry, EOMI,   oropharynx pink and moist.  Neck supple no JVD, no mass.  Chest: Clear to auscultation bilaterally.Decreased though adequate air entry  CVS: S1, S2 no murmurs, no S3.Regular rate.  ABD: Soft non tender.   Ext: No edema  MS: Adequate ROM spine, shoulders, hips and knees.  Skin:Infected scalp lesion , max diameter approx 6 cm tender , no  Visible purulent drainage 2 puncture wounds visible in area, no retained insect / part of insect seen Psych: Good eye contact, normal affect. Memory intact not anxious or depressed appearing.  CNS: CN 2-12 intact, power,  normal  throughout.no focal deficits noted.   Assessment & Plan  Spider bite 1 week ago spider bite with purulent drainage will prescribe doxycycline for 10 days, no  Drainage noted  At visit , although area remains swollen and tender , max diameter a[pproximately  cm  Tobacco dependency Asked: confirms Still smokes cigarettes  8 /d ay Assess: unwilling to set quit date Advise needs to quit, to reduce lung disease and cancer, heart disease and stroke Assist: counseled for 5 mins and literature provided Arrrange f/U in 3 to 4 months   LOW BACK PAIN, CHRONIC Continue gabapentin as before  Psychiatric disorder Managed by psychiatry and stable

## 2018-06-22 ENCOUNTER — Encounter: Payer: Self-pay | Admitting: Family Medicine

## 2018-06-22 NOTE — Assessment & Plan Note (Signed)
Managed by psychiatry and stable 

## 2018-06-22 NOTE — Assessment & Plan Note (Signed)
Continue gabapentin as before 

## 2018-08-06 DIAGNOSIS — Z79899 Other long term (current) drug therapy: Secondary | ICD-10-CM | POA: Diagnosis not present

## 2018-09-02 ENCOUNTER — Ambulatory Visit: Payer: Self-pay | Admitting: Family Medicine

## 2018-09-09 ENCOUNTER — Ambulatory Visit: Payer: Self-pay

## 2018-10-07 ENCOUNTER — Encounter: Payer: Self-pay | Admitting: Family Medicine

## 2018-10-07 ENCOUNTER — Ambulatory Visit: Payer: Self-pay

## 2018-10-14 ENCOUNTER — Encounter: Payer: Self-pay | Admitting: *Deleted

## 2018-11-05 ENCOUNTER — Encounter: Payer: Self-pay | Admitting: *Deleted

## 2018-11-18 ENCOUNTER — Encounter: Payer: Self-pay | Admitting: Family Medicine

## 2018-11-18 ENCOUNTER — Ambulatory Visit (INDEPENDENT_AMBULATORY_CARE_PROVIDER_SITE_OTHER): Payer: Medicare Other | Admitting: Family Medicine

## 2018-11-18 VITALS — BP 130/80 | HR 102 | Resp 14 | Ht 72.0 in | Wt 164.1 lb

## 2018-11-18 DIAGNOSIS — I10 Essential (primary) hypertension: Secondary | ICD-10-CM | POA: Diagnosis not present

## 2018-11-18 DIAGNOSIS — Z23 Encounter for immunization: Secondary | ICD-10-CM

## 2018-11-18 DIAGNOSIS — Z79899 Other long term (current) drug therapy: Secondary | ICD-10-CM

## 2018-11-18 DIAGNOSIS — Z0000 Encounter for general adult medical examination without abnormal findings: Secondary | ICD-10-CM

## 2018-11-18 DIAGNOSIS — L219 Seborrheic dermatitis, unspecified: Secondary | ICD-10-CM | POA: Diagnosis not present

## 2018-11-18 DIAGNOSIS — F17200 Nicotine dependence, unspecified, uncomplicated: Secondary | ICD-10-CM

## 2018-11-18 DIAGNOSIS — Z1211 Encounter for screening for malignant neoplasm of colon: Secondary | ICD-10-CM

## 2018-11-18 DIAGNOSIS — E559 Vitamin D deficiency, unspecified: Secondary | ICD-10-CM

## 2018-11-18 DIAGNOSIS — S65513A Laceration of blood vessel of left middle finger, initial encounter: Secondary | ICD-10-CM | POA: Diagnosis not present

## 2018-11-18 DIAGNOSIS — F17210 Nicotine dependence, cigarettes, uncomplicated: Secondary | ICD-10-CM

## 2018-11-18 DIAGNOSIS — Z125 Encounter for screening for malignant neoplasm of prostate: Secondary | ICD-10-CM

## 2018-11-18 MED ORDER — BETAMETHASONE DIPROPIONATE 0.05 % EX CREA
TOPICAL_CREAM | Freq: Two times a day (BID) | CUTANEOUS | 0 refills | Status: DC
Start: 2018-11-18 — End: 2020-02-24

## 2018-11-18 MED ORDER — BUPROPION HCL ER (SR) 150 MG PO TB12: 150.0000 mg | ORAL_TABLET | Freq: Two times a day (BID) | ORAL | 5 refills | Status: DC

## 2018-11-18 MED ORDER — DOXYCYCLINE HYCLATE 100 MG PO TABS: 100.0000 mg | ORAL_TABLET | Freq: Two times a day (BID) | ORAL | 0 refills | Status: DC

## 2018-11-18 MED ORDER — AMLODIPINE BESYLATE 2.5 MG PO TABS: 2.5000 mg | ORAL_TABLET | Freq: Every day | ORAL | 3 refills | Status: DC

## 2018-11-18 NOTE — Progress Notes (Signed)
   Jimmy Fritz     MRN: [id number]      DOB: 02/23/1961   HPI: Patient is in for annual physical exam. Laceration to left middle finger 11/17/2018  Immunization is reviewed , and  updated d.    PE; BP 130/80 (BP Location: Right Arm, Patient Position: Sitting, Cuff Size: Large)   Pulse (!) 102   Resp 14   Ht 6' (1.829 m)   Wt 164 lb 1.9 oz (74.4 kg)   SpO2 94% Comment: room air  BMI 22.26 kg/m   Pleasant male, alert and oriented x 3, in no cardio-pulmonary distress. Afebrile. HEENT No facial trauma or asymetry. Sinuses non tender. EOMI External ears normal, tympanic membranes clear. Oropharynx moist, no exudate. Neck: supple, no adenopathy,JVD or thyromegaly.No bruits.  Chest: Clear to ascultation bilaterally.No crackles or wheezes.Decreased though aquae air entry Non tender to palpation  Breast: No asymetry,no masses. No nipple discharge or inversion. No axillary or supraclavicular adenopathy  Cardiovascular system; Heart sounds normal,  S1 and  S2 ,no S3.  No murmur, or thrill. Apical beat not displaced Peripheral pulses normal.  Abdomen: Soft, non tender, no organomegaly or masses. No bruits. Bowel sounds normal. No guarding, tenderness or rebound.   Musculoskeletal exam: Full ROM of spine, hips , shoulders and knees. No deformity ,swelling or crepitus noted. No muscle wasting or atrophy.   Neurologic: Cranial nerves 2 to 12 intact. Power, tone ,sensation and reflexes normal throughout. No disturbance in gait. No tremor.  Skin: Laceration length 4  cm to left middle digit, no purulent drainage, but erythema present , scaling  rash noted.on posterior scalp Pigmentation normal throughout  Psych; Normal mood and affect.    Assessment & Plan:  Need for diphtheria-tetanus-pertussis (Tdap) vaccine After obtaining informed consent, the vaccine is  administered due to open laceration of left middle finger  Need for immunization against  influenza After obtaining informed consent, the vaccine is  administered by LPN.   Annual physical exam Annual exam as documented. Counseling done  re healthy lifestyle involving commitment to 150 minutes exercise per week, heart healthy diet, and attaining healthy weight.The importance of adequate sleep also discussed.  Immunization and cancer screening needs are specifically addressed at this visit.   Laceration of blood vessel of left middle finger Open Laceration to left middle digit approx 24 hours old, length 4cm, cleaned with saline and loose gauze dressing applied, antibiotic course and TDaP administered  Seborrheic dermatitis of scalp Topical cream prescribed for twice daily use for 1 week, then as needed  Essential hypertension Start anti hypertensive DASH diet and commitment to daily physical activity for a minimum of 30 minutes discussed and encouraged, as a part of hypertension management. The importance of attaining a healthy weight is also discussed.  BP/Weight 11/18/2018 06/20/2018 08/27/2017 03/26/2017 12/25/2016 05/05/2016 03/01/2016  Systolic BP 130 130 120 130 124 133 118  Diastolic BP 80 80 80 80 90 94 80  Wt. (Lbs) 164.12 157.08 157 160 161 - 152  BMI 22.26 21.3 22.53 22.96 23.1 - 21.81        Tobacco dependency Asked:confirms currently smokes20 cigarettes/ day Assess: Willint  to quit and has been cutting back Advise: needs to QUIT to reduce risk of cancer, cardio and cerebrovascular disease Assist: counseled for 5 minutes and literature provided, prescription for Wellbutrin sent with plan to quit in 2 weeks of starting medication Arrange: follow up in 3 months

## 2018-11-18 NOTE — Assessment & Plan Note (Addendum)
After obtaining informed consent, the vaccine is  administered due to open laceration of left middle finger

## 2018-11-18 NOTE — Assessment & Plan Note (Signed)
After obtaining informed consent, the vaccine is  administered by LPN.  

## 2018-11-18 NOTE — Patient Instructions (Addendum)
F/U in 4 to 5 weeks  With EKG, call if you need me sooner  Laceration pon finger is to be kept clean wash in salt water daily and use clean dry gauze loose dressing, change every day If worsens need to call office  10 days antibiotic prescribed and TdaP given  Flu vaccine today  New for blood pressure is amlodipine daily  New for dry patches that itch on scalp is a cream  New to help with stopping smoking, now 1PPD is wellbutrin,  Labs today, cbc, lipid and chem 7 and pSA  Gabapentin to be refilled for 2 months only at this time  Cologuard to be sent in for colon screen   Steps to Quit Smoking  Smoking tobacco can be bad for your health. It can also affect almost every organ in your body. Smoking puts you and people around you at risk for many serious long-lasting (chronic) diseases. Quitting smoking is hard, but it is one of the best things that you can do for your health. It is never too late to quit. What are the benefits of quitting smoking? When you quit smoking, you lower your risk for getting serious diseases and conditions. They can include:  Lung cancer or lung disease.  Heart disease.  Stroke.  Heart attack.  Not being able to have children (infertility).  Weak bones (osteoporosis) and broken bones (fractures). If you have coughing, wheezing, and shortness of breath, those symptoms may get better when you quit. You may also get sick less often. If you are pregnant, quitting smoking can help to lower your chances of having a baby of low birth weight. What can I do to help me quit smoking? Talk with your doctor about what can help you quit smoking. Some things you can do (strategies) include:  Quitting smoking totally, instead of slowly cutting back how much you smoke over a period of time.  Going to in-person counseling. You are more likely to quit if you go to many counseling sessions.  Using resources and support systems, such as: ? Agricultural engineernline chats with a  Veterinary surgeoncounselor. ? Phone quitlines. ? Automotive engineerrinted self-help materials. ? Support groups or group counseling. ? Text messaging programs. ? Mobile phone apps or applications.  Taking medicines. Some of these medicines may have nicotine in them. If you are pregnant or breastfeeding, do not take any medicines to quit smoking unless your doctor says it is okay. Talk with your doctor about counseling or other things that can help you. Talk with your doctor about using more than one strategy at the same time, such as taking medicines while you are also going to in-person counseling. This can help make quitting easier. What things can I do to make it easier to quit? Quitting smoking might feel very hard at first, but there is a lot that you can do to make it easier. Take these steps:  Talk to your family and friends. Ask them to support and encourage you.  Call phone quitlines, reach out to support groups, or work with a Veterinary surgeoncounselor.  Ask people who smoke to not smoke around you.  Avoid places that make you want (trigger) to smoke, such as: ? Bars. ? Parties. ? Smoke-break areas at work.  Spend time with people who do not smoke.  Lower the stress in your life. Stress can make you want to smoke. Try these things to help your stress: ? Getting regular exercise. ? Deep-breathing exercises. ? Yoga. ? Meditating. ? Doing a  body scan. To do this, close your eyes, focus on one area of your body at a time from head to toe, and notice which parts of your body are tense. Try to relax the muscles in those areas.  Download or buy apps on your mobile phone or tablet that can help you stick to your quit plan. There are many free apps, such as QuitGuide from the [employer] developer(Centers for Disease Control and Prevention). You can find more support from smokefree.gov and other websites. This information is not intended to replace advice given to you by your health care provider. Make sure you discuss any questions you have with  your health care provider. Document Released: 09/02/2009 Document Revised: 07/04/2016 Document Reviewed: 03/23/2015 Elsevier Interactive Patient Education  2019 [employer]

## 2018-11-19 LAB — CBC
HCT: 43 % (ref 38.5–50.0)
HEMOGLOBIN: 15 g/dL (ref 13.2–17.1)
MCH: 31.3 pg (ref 27.0–33.0)
MCHC: 34.9 g/dL (ref 32.0–36.0)
MCV: 89.8 fL (ref 80.0–100.0)
MPV: 10.9 fL (ref 7.5–12.5)
Platelets: 253 10*3/uL (ref 140–400)
RBC: 4.79 10*6/uL (ref 4.20–5.80)
RDW: 12.6 % (ref 11.0–15.0)
WBC: 8.4 10*3/uL (ref 3.8–10.8)

## 2018-11-19 LAB — LIPID PANEL
CHOL/HDL RATIO: 3.4 (calc) (ref <5.0)
Cholesterol: 159 mg/dL (ref <200)
HDL: 47 mg/dL (ref >40)
LDL CHOLESTEROL (CALC): 98 mg/dL
Non-HDL Cholesterol (Calc): 112 mg/dL (calc) (ref <130)
Triglycerides: 58 mg/dL (ref <150)

## 2018-11-19 LAB — BASIC METABOLIC PANEL
BUN: 12 mg/dL (ref 7–25)
CHLORIDE: 106 mmol/L (ref 98–110)
CO2: 25 mmol/L (ref 20–32)
Calcium: 9.5 mg/dL (ref 8.6–10.3)
Creat: 0.91 mg/dL (ref 0.70–1.33)
Glucose, Bld: 106 mg/dL (ref 65–139)
POTASSIUM: 4.2 mmol/L (ref 3.5–5.3)
Sodium: 140 mmol/L (ref 135–146)

## 2018-11-19 LAB — PSA: PSA: 2.4 ng/mL (ref <=4.0)

## 2018-11-26 ENCOUNTER — Ambulatory Visit: Payer: Medicare Other

## 2018-11-30 ENCOUNTER — Encounter: Payer: Self-pay | Admitting: Family Medicine

## 2018-11-30 NOTE — Assessment & Plan Note (Signed)
Annual exam as documented. Counseling done  re healthy lifestyle involving commitment to 150 minutes exercise per week, heart healthy diet, and attaining healthy weight.The importance of adequate sleep also discussed.  Immunization and cancer screening needs are specifically addressed at this visit.  

## 2018-11-30 NOTE — Assessment & Plan Note (Signed)
Open Laceration to left middle digit approx 24 hours old, length 4cm, cleaned with saline and loose gauze dressing applied, antibiotic course and TDaP administered

## 2018-11-30 NOTE — Assessment & Plan Note (Signed)
Asked:confirms currently smokes20 cigarettes/ day Assess: Willint  to quit and has been cutting back Advise: needs to QUIT to reduce risk of cancer, cardio and cerebrovascular disease Assist: counseled for 5 minutes and literature provided, prescription for Wellbutrin sent with plan to quit in 2 weeks of starting medication Arrange: follow up in 3 months

## 2018-11-30 NOTE — Assessment & Plan Note (Signed)
Topical cream prescribed for twice daily use for 1 week, then as needed

## 2018-11-30 NOTE — Assessment & Plan Note (Signed)
Start anti hypertensive DASH diet and commitment to daily physical activity for a minimum of 30 minutes discussed and encouraged, as a part of hypertension management. The importance of attaining a healthy weight is also discussed.  BP/Weight 11/18/2018 06/20/2018 08/27/2017 03/26/2017 12/25/2016 05/05/2016 03/01/2016  Systolic BP 130 130 120 130 124 133 118  Diastolic BP 80 80 80 80 90 94 80  Wt. (Lbs) 164.12 157.08 157 160 161 - 152  BMI 22.26 21.3 22.53 22.96 23.1 - 21.81

## 2018-12-10 ENCOUNTER — Encounter: Payer: Self-pay | Admitting: *Deleted

## 2018-12-13 ENCOUNTER — Encounter: Payer: Self-pay | Admitting: Family Medicine

## 2018-12-23 ENCOUNTER — Encounter: Payer: Self-pay | Admitting: Family Medicine

## 2018-12-23 ENCOUNTER — Ambulatory Visit (INDEPENDENT_AMBULATORY_CARE_PROVIDER_SITE_OTHER): Payer: Medicare Other | Admitting: Family Medicine

## 2018-12-23 ENCOUNTER — Other Ambulatory Visit: Payer: Self-pay

## 2018-12-23 VITALS — BP 124/82 | HR 98 | Resp 12 | Ht 72.0 in | Wt 163.1 lb

## 2018-12-23 DIAGNOSIS — F17200 Nicotine dependence, unspecified, uncomplicated: Secondary | ICD-10-CM | POA: Diagnosis not present

## 2018-12-23 DIAGNOSIS — R7301 Impaired fasting glucose: Secondary | ICD-10-CM

## 2018-12-23 DIAGNOSIS — I10 Essential (primary) hypertension: Secondary | ICD-10-CM

## 2018-12-23 DIAGNOSIS — R7302 Impaired glucose tolerance (oral): Secondary | ICD-10-CM | POA: Diagnosis not present

## 2018-12-23 MED ORDER — AMLODIPINE BESYLATE 2.5 MG PO TABS: 2.5000 mg | ORAL_TABLET | Freq: Every day | ORAL | 3 refills | Status: DC

## 2018-12-23 NOTE — Assessment & Plan Note (Signed)
Asked:confirms currently smokes approx 7 cigarettes Assess: willing to quit and  cutting back Advise: needs to QUIT to reduce risk of cancer, cardio and cerebrovascular disease Assist: counseled for 5 minutes and literature provided Arrange: follow up in 3 months

## 2018-12-23 NOTE — Assessment & Plan Note (Signed)
Patient educated about the importance of limiting  Carbohydrate intake , the need to commit to daily physical activity for a minimum of 30 minutes , and to commit weight loss. The fact that changes in all these areas will reduce or eliminate all together the development of diabetes is stressed.  Updated lab needed at/ before next visit.   Diabetic Labs Latest Ref Rng & Units 11/18/2018 03/01/2016 01/27/2015 02/26/2014 09/03/2013  HbA1c <5.7 % - 5.9(H) 5.9(H) 5.8(H) -  Chol <200 mg/dL 817 - - - -  HDL >71 mg/dL 47 - - - -  Calc LDL mg/dL (calc) 98 - - - -  Triglycerides <150 mg/dL 58 - - - -  Creatinine 0.70 - 1.33 mg/dL 1.65 7.90 3.83 3.38 3.29   BP/Weight 12/23/2018 11/18/2018 06/20/2018 08/27/2017 03/26/2017 12/25/2016 05/05/2016  Systolic BP 124 130 130 120 130 124 133  Diastolic BP 82 80 80 80 80 90 94  Wt. (Lbs) 163.08 164.12 157.08 157 160 161 -  BMI 22.12 22.26 21.3 22.53 22.96 23.1 -   No flowsheet data found.

## 2018-12-23 NOTE — Patient Instructions (Signed)
F/u in 6 months, call if you need me before  Blood pressure excellent, stay on same medication, and EKG is NORMAL  Increase the fruit and vegetable, and stop the cobblers and sweets  HBA1C and non fast chem 7 and EGFR in 5.5 months  Do not need to take aspirin so no need to refill  Continue to reduce your cigarettes now at 7/ day, plan to have QUIT in the next 6 months, you CAN DO THIS  Nurse to review how pt is to collect  cologuard specimen , again, per his request  Thank you  for choosing [facility]. We consider it a privelige to serve you.  Delivering excellent health care in a caring and  compassionate way is our goal.  Partnering with you,  so that together we can achieve this goal is our strategy.

## 2018-12-23 NOTE — Assessment & Plan Note (Signed)
Controlled, no change in medication DASH diet and commitment to daily physical activity for a minimum of 30 minutes discussed and encouraged, as a part of hypertension management. The importance of attaining a healthy weight is also discussed.  BP/Weight 12/23/2018 11/18/2018 06/20/2018 08/27/2017 03/26/2017 12/25/2016 05/05/2016  Systolic BP 124 130 130 120 130 124 133  Diastolic BP 82 80 80 80 80 90 94  Wt. (Lbs) 163.08 164.12 157.08 157 160 161 -  BMI 22.12 22.26 21.3 22.53 22.96 23.1 -  EKG: normal rhythm rate and no LVH

## 2018-12-23 NOTE — Progress Notes (Signed)
   Jimmy Fritz     MRN: [id number]      DOB: [birth year]   HPI Mr. Heinle is here for follow up and re-evaluation of chronic medical conditions,in particular hypertension and nicotine use  Medication management and review of any available recent lab and radiology data.  Preventive health is updated, specifically  Cancer screening and Immunization.   The PT denies any adverse reactions to current medications since the last visit.  There are no new concerns.  There are no specific complaints   ROS Denies recent fever or chills. Denies sinus pressure, nasal congestion, ear pain or sore throat. Denies chest congestion, productive cough or wheezing. Denies chest pains, palpitations and leg swelling Denies abdominal pain, nausea, vomiting,diarrhea or constipation.   Denies dysuria, frequency, hesitancy or incontinence. Denies uncontrolled  joint pain, swelling and limitation in mobility. Denies headaches, seizures, numbness, or tingling. Denies uncontroled  depression, anxiety or insomnia. Denies skin break down or rash.   PE  BP 124/82   Pulse 98   Resp 12   Ht 6' (1.829 m)   Wt 163 lb 1.3 oz (74 kg)   SpO2 97%   BMI 22.12 kg/m   Patient alert and oriented and in no cardiopulmonary distress.  HEENT: No facial asymmetry, EOMI,   oropharynx pink and moist.  Neck supple no JVD, no mass.  Chest: Clear to auscultation bilaterally.  CVS: S1, S2 no murmurs, no S3.Regular rate.  EKG: NSR, no ischemia or LVH  ABD: Soft non tender.   Ext: No edema  MS: Adequate ROM spine, shoulders, hips and knees.  Skin: Intact, no ulcerations or rash noted.  Psych: Good eye contact, normal affect. Memory intact not anxious or depressed appearing.  CNS: CN 2-12 intact, power,  normal throughout.no focal deficits noted.   Assessment & Plan  Tobacco dependency Asked:confirms currently smokes approx 7 cigarettes Assess: willing to quit and  cutting back Advise: needs to QUIT to  reduce risk of cancer, cardio and cerebrovascular disease Assist: counseled for 5 minutes and literature provided Arrange: follow up in 3 months   Essential hypertension Controlled, no change in medication DASH diet and commitment to daily physical activity for a minimum of 30 minutes discussed and encouraged, as a part of hypertension management. The importance of attaining a healthy weight is also discussed.  BP/Weight 12/23/2018 11/18/2018 06/20/2018 08/27/2017 03/26/2017 12/25/2016 05/05/2016  Systolic BP 124 130 130 120 130 124 133  Diastolic BP 82 80 80 80 80 90 94  Wt. (Lbs) 163.08 164.12 157.08 157 160 161 -  BMI 22.12 22.26 21.3 22.53 22.96 23.1 -  EKG: normal rhythm rate and no LVH

## 2019-01-13 ENCOUNTER — Ambulatory Visit: Payer: Self-pay

## 2019-03-21 ENCOUNTER — Other Ambulatory Visit: Payer: Self-pay | Admitting: Family Medicine

## 2019-05-07 ENCOUNTER — Other Ambulatory Visit: Payer: Self-pay

## 2019-05-07 ENCOUNTER — Ambulatory Visit (INDEPENDENT_AMBULATORY_CARE_PROVIDER_SITE_OTHER): Payer: Medicare Other | Admitting: Family Medicine

## 2019-05-07 ENCOUNTER — Encounter (INDEPENDENT_AMBULATORY_CARE_PROVIDER_SITE_OTHER): Payer: Self-pay

## 2019-05-07 ENCOUNTER — Encounter: Payer: Self-pay | Admitting: Family Medicine

## 2019-05-07 VITALS — BP 124/82 | HR 98 | Ht 72.0 in | Wt 163.0 lb

## 2019-05-07 DIAGNOSIS — Z0000 Encounter for general adult medical examination without abnormal findings: Secondary | ICD-10-CM | POA: Diagnosis not present

## 2019-05-07 NOTE — Patient Instructions (Signed)
[name] , Thank you for taking time to come for your Medicare Wellness Visit. I appreciate your ongoing commitment to your health goals. Please review the following plan we discussed and let me know if I can assist you in the future.   Screening recommendations/referrals: Colonoscopy: Please consider having this done in near future Recommended yearly ophthalmology/optometry visit for glaucoma screening and checkup Recommended yearly dental visit for hygiene and checkup  Vaccinations: Influenza vaccine: Due Fall 2020 Pneumococcal vaccine: Discuss with Dr Lodema HongSimpson Tdap vaccine: Due 2029 Shingles vaccine: Discuss with Dr Lodema HongSimpson in next few years you will qualify for this.    Conditions/risks identified: smoking   Next appointment: 06/23/2019   Preventive Care 40-64 Years, Male Preventive care refers to lifestyle choices and visits with your health care provider that can promote health and wellness. What does preventive care include?  A yearly physical exam. This is also called an annual well check.  Dental exams once or twice a year.  Routine eye exams. Ask your health care provider how often you should have your eyes checked.  Personal lifestyle choices, including:  Daily care of your teeth and gums.  Regular physical activity.  Eating a healthy diet.  Avoiding tobacco and drug use.  Limiting alcohol use.  Practicing safe sex.  Taking low-dose aspirin every day starting at age 56. What happens during an annual well check? The services and screenings done by your health care provider during your annual well check will depend on your age, overall health, lifestyle risk factors, and family history of disease. Counseling  Your health care provider may ask you questions about your:  Alcohol use.  Tobacco use.  Drug use.  Emotional well-being.  Home and relationship well-being.  Sexual activity.  Eating habits.  Work and work Astronomerenvironment. Screening  You  may have the following tests or measurements:  Height, weight, and BMI.  Blood pressure.  Lipid and cholesterol levels. These may be checked every 5 years, or more frequently if you are over 56 years old.  Skin check.  Lung cancer screening. You may have this screening every year starting at age 56 if you have a 30-pack-year history of smoking and currently smoke or have quit within the past 15 years.  Fecal occult blood test (FOBT) of the stool. You may have this test every year starting at age 56.  Flexible sigmoidoscopy or colonoscopy. You may have a sigmoidoscopy every 5 years or a colonoscopy every 10 years starting at age 56.  Prostate cancer screening. Recommendations will vary depending on your family history and other risks.  Hepatitis C blood test.  Hepatitis B blood test.  Sexually transmitted disease (STD) testing.  Diabetes screening. This is done by checking your blood sugar (glucose) after you have not eaten for a while (fasting). You may have this done every 1-3 years. Discuss your test results, treatment options, and if necessary, the need for more tests with your health care provider. Vaccines  Your health care provider may recommend certain vaccines, such as:  Influenza vaccine. This is recommended every year.  Tetanus, diphtheria, and acellular pertussis (Tdap, Td) vaccine. You may need a Td booster every 10 years.  Zoster vaccine. You may need this after age 56.  Pneumococcal 13-valent conjugate (PCV13) vaccine. You may need this if you have certain conditions and have not been vaccinated.  Pneumococcal polysaccharide (PPSV23) vaccine. You may need one or two doses if you smoke cigarettes or if you have certain conditions. Talk to  your health care provider about which screenings and vaccines you need and how often you need them. This information is not intended to replace advice given to you by your health care provider. Make sure you discuss any questions  you have with your health care provider. Document Released: 12/03/2015 Document Revised: 07/26/2016 Document Reviewed: 09/07/2015 Elsevier Interactive Patient Education  2017 [place] Prevention in the Home Falls can cause injuries. They can happen to people of all ages. There are many things you can do to make your home safe and to help prevent falls. What can I do on the outside of my home?  Regularly fix the edges of walkways and driveways and fix any cracks.  Remove anything that might make you trip as you walk through a door, such as a raised step or threshold.  Trim any bushes or trees on the path to your home.  Use bright outdoor lighting.  Clear any walking paths of anything that might make someone trip, such as rocks or tools.  Regularly check to see if handrails are loose or broken. Make sure that both sides of any steps have handrails.  Any raised decks and porches should have guardrails on the edges.  Have any leaves, snow, or ice cleared regularly.  Use sand or salt on walking paths during winter.  Clean up any spills in your garage right away. This includes oil or grease spills. What can I do in the bathroom?  Use night lights.  Install grab bars by the toilet and in the tub and shower. Do not use towel bars as grab bars.  Use non-skid mats or decals in the tub or shower.  If you need to sit down in the shower, use a plastic, non-slip stool.  Keep the floor dry. Clean up any water that spills on the floor as soon as it happens.  Remove soap buildup in the tub or shower regularly.  Attach bath mats securely with double-sided non-slip rug tape.  Do not have throw rugs and other things on the floor that can make you trip. What can I do in the bedroom?  Use night lights.  Make sure that you have a light by your bed that is easy to reach.  Do not use any sheets or blankets that are too big for your bed. They should not hang down onto the floor.   Have a firm chair that has side arms. You can use this for support while you get dressed.  Do not have throw rugs and other things on the floor that can make you trip. What can I do in the kitchen?  Clean up any spills right away.  Avoid walking on wet floors.  Keep items that you use a lot in easy-to-reach places.  If you need to reach something above you, use a strong step stool that has a grab bar.  Keep electrical cords out of the way.  Do not use floor polish or wax that makes floors slippery. If you must use wax, use non-skid floor wax.  Do not have throw rugs and other things on the floor that can make you trip. What can I do with my stairs?  Do not leave any items on the stairs.  Make sure that there are handrails on both sides of the stairs and use them. Fix handrails that are broken or loose. Make sure that handrails are as long as the stairways.  Check any carpeting to make sure that it  is firmly attached to the stairs. Fix any carpet that is loose or worn.  Avoid having throw rugs at the top or bottom of the stairs. If you do have throw rugs, attach them to the floor with carpet tape.  Make sure that you have a light switch at the top of the stairs and the bottom of the stairs. If you do not have them, ask someone to add them for you. What else can I do to help prevent falls?  Wear shoes that:  Do not have high heels.  Have rubber bottoms.  Are comfortable and fit you well.  Are closed at the toe. Do not wear sandals.  If you use a stepladder:  Make sure that it is fully opened. Do not climb a closed stepladder.  Make sure that both sides of the stepladder are locked into place.  Ask someone to hold it for you, if possible.  Clearly mark and make sure that you can see:  Any grab bars or handrails.  First and last steps.  Where the edge of each step is.  Use tools that help you move around (mobility aids) if they are needed. These include:   Canes.  Walkers.  Scooters.  Crutches.  Turn on the lights when you go into a dark area. Replace any light bulbs as soon as they burn out.  Set up your furniture so you have a clear path. Avoid moving your furniture around.  If any of your floors are uneven, fix them.  If there are any pets around you, be aware of where they are.  Review your medicines with your doctor. Some medicines can make you feel dizzy. This can increase your chance of falling. Ask your doctor what other things that you can do to help prevent falls. This information is not intended to replace advice given to you by your health care provider. Make sure you discuss any questions you have with your health care provider. Document Released: 09/02/2009 Document Revised: 04/13/2016 Document Reviewed: 12/11/2014 Elsevier Interactive Patient Education  2017 [employer]
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
## 2019-06-23 ENCOUNTER — Encounter: Payer: Self-pay | Admitting: Family Medicine

## 2019-06-23 ENCOUNTER — Other Ambulatory Visit: Payer: Self-pay

## 2019-06-23 ENCOUNTER — Ambulatory Visit (INDEPENDENT_AMBULATORY_CARE_PROVIDER_SITE_OTHER): Payer: Medicare Other | Admitting: Family Medicine

## 2019-06-23 VITALS — BP 124/82 | HR 98 | Resp 12 | Ht 72.0 in | Wt 163.0 lb

## 2019-06-23 DIAGNOSIS — I10 Essential (primary) hypertension: Secondary | ICD-10-CM | POA: Diagnosis not present

## 2019-06-23 DIAGNOSIS — M5440 Lumbago with sciatica, unspecified side: Secondary | ICD-10-CM

## 2019-06-23 DIAGNOSIS — F17200 Nicotine dependence, unspecified, uncomplicated: Secondary | ICD-10-CM | POA: Diagnosis not present

## 2019-06-23 DIAGNOSIS — G8929 Other chronic pain: Secondary | ICD-10-CM | POA: Diagnosis not present

## 2019-06-23 MED ORDER — AMLODIPINE BESYLATE 2.5 MG PO TABS: 2.5000 mg | ORAL_TABLET | Freq: Every day | ORAL | 5 refills | Status: DC

## 2019-06-23 MED ORDER — GABAPENTIN 800 MG PO TABS: 800.0000 mg | ORAL_TABLET | Freq: Three times a day (TID) | ORAL | 5 refills | Status: DC

## 2019-06-23 NOTE — Progress Notes (Signed)
Virtual Visit via Telephone Note  I connected with Jimmy Fritz on 06/23/19 at 11:00 AM EDT by telephone and verified that I am speaking with the correct person using two identifiers.  Location: Patient: home  Provider: office   I discussed the limitations, risks, security and privacy concerns of performing an evaluation and management service by telephone and the availability of in person appointments. I also discussed with the patient that there may be a patient responsible charge related to this service. The patient expressed understanding and agreed to proceed. This visit type is conducted due to national recommendations for restrictions regarding the COVID -19 Pandemic. Due to the patient's age and / or co morbidities, this format is felt to be most appropriate at this time without adequate follow up. The patient has no access to video technology/ had technical difficulties with video, requiring transitioning to audio format  only ( telephone ). All issues noted this document were discussed and addressed,no physical exam can be performed in this format.  History of Present Illness: F/u uncontrolled hypertension and back pain Denies recent fever or chills. Denies sinus pressure, nasal congestion, ear pain or sore throat. Denies chest congestion, productive cough or wheezing. Denies chest pains, palpitations and leg swelling Denies abdominal pain, nausea, vomiting,diarrhea or constipation.   Denies dysuria, frequency, hesitancy or incontinence. C/o back pain and limitation in mobility.Out of medication x 3 days Denies headaches, seizures, numbness, or tingling. Denies depression, anxiety or insomnia. Denies skin break down or rash.       Observations/Objective: BP 124/82   Pulse 98   Resp 12   Ht 6' (1.829 m)   Wt 163 lb (73.9 kg)   BMI 22.11 kg/m  Good communication with no confusion and intact memory. Alert and oriented x 3 No signs of respiratory distress during  speech    Assessment and Plan: Tobacco dependency Asked:confirms stopped smoking recently and wants to remain quit, very tempteda t times Assess: willing to quit but needs help Advise: needs to QUIT to reduce risk of cancer, cardio and cerebrovascular disease Assist: counseled for 5 minutes and literature provided Arrange: follow up in 3 months   LOW BACK PAIN, CHRONIC Reports unontrolled asout of gabapentin, refill sent for net 6 months  Essential hypertension Controlled, no change in medication DASH diet and commitment to daily physical activity for a minimum of 30 minutes discussed and encouraged, as a part of hypertension management. The importance of attaining a healthy weight is also discussed.  BP/Weight 06/23/2019 05/07/2019 12/23/2018 11/18/2018 06/20/2018 16/11/958 02/22/4097  Systolic BP 119 147 829 562 130 865 784  Diastolic BP 82 82 82 80 80 80 80  Wt. (Lbs) 163 163 163.08 164.12 157.08 157 160  BMI 22.11 22.11 22.12 22.26 21.3 22.53 22.96         Follow Up Instructions:    I discussed the assessment and treatment plan with the patient. The patient was provided an opportunity to ask questions and all were answered. The patient agreed with the plan and demonstrated an understanding of the instructions.   The patient was advised to call back or seek an in-person evaluation if the symptoms worsen or if the condition fails to improve as anticipated.  I provided 15 minutes of non-face-to-face time during this encounter.   Tula Nakayama, MD

## 2019-06-23 NOTE — Patient Instructions (Signed)
Annual physical exam with Md FIRST WEEK January, CALL IF YOU NEED ME SOONER Good that you have stopped smoking in past 7 days, keep this up, call [email or web address] for support 24/7  Resume amlodipine due to high blood pressure   Gabapentin is prescribed  Please send stool for testing / screening for colon cancer, could be a life saver!  Social distancing. Frequent hand washing with soap and water Keeping your hands off of your face. These 3 practices will help to keep both you and your community healthy during this time. Please practice them faithfully!   Thanks for choosing [facility], we consider it a privelige to serve you.

## 2019-06-24 ENCOUNTER — Telehealth: Payer: Self-pay

## 2019-06-24 DIAGNOSIS — R7301 Impaired fasting glucose: Secondary | ICD-10-CM | POA: Diagnosis not present

## 2019-06-24 NOTE — Telephone Encounter (Signed)
Labs ordered.

## 2019-06-25 LAB — HEMOGLOBIN A1C
Hgb A1c MFr Bld: 5.7 % of total Hgb — ABNORMAL HIGH (ref <5.7)
Mean Plasma Glucose: 117 (calc)
eAG (mmol/L): 6.5 (calc)

## 2019-06-25 LAB — BASIC METABOLIC PANEL WITH GFR
BUN: 11 mg/dL (ref 7–25)
CO2: 26 mmol/L (ref 20–32)
Calcium: 9.7 mg/dL (ref 8.6–10.3)
Chloride: 103 mmol/L (ref 98–110)
Creat: 0.81 mg/dL (ref 0.70–1.33)
GFR, Est African American: 116 mL/min/{1.73_m2} (ref >=60)
GFR, Est Non African American: 100 mL/min/{1.73_m2} (ref >=60)
Glucose, Bld: 138 mg/dL (ref 65–139)
Potassium: 3.8 mmol/L (ref 3.5–5.3)
Sodium: 135 mmol/L (ref 135–146)

## 2019-06-25 NOTE — Telephone Encounter (Signed)
Lab results mailed to patient.

## 2019-06-29 ENCOUNTER — Encounter: Payer: Self-pay | Admitting: Family Medicine

## 2019-06-29 NOTE — Assessment & Plan Note (Signed)
Asked:confirms stopped smoking recently and wants to remain quit, very tempteda t times Assess: willing to quit but needs help Advise: needs to QUIT to reduce risk of cancer, cardio and cerebrovascular disease Assist: counseled for 5 minutes and literature provided Arrange: follow up in 3 months

## 2019-06-29 NOTE — Assessment & Plan Note (Signed)
Reports unontrolled asout of gabapentin, refill sent for net 6 months

## 2019-06-29 NOTE — Assessment & Plan Note (Signed)
Controlled, no change in medication DASH diet and commitment to daily physical activity for a minimum of 30 minutes discussed and encouraged, as a part of hypertension management. The importance of attaining a healthy weight is also discussed.  BP/Weight 06/23/2019 05/07/2019 12/23/2018 11/18/2018 06/20/2018 82/04/4157 3/0/9407  Systolic BP 680 881 103 159 458 592 924  Diastolic BP 82 82 82 80 80 80 80  Wt. (Lbs) 163 163 163.08 164.12 157.08 157 160  BMI 22.11 22.11 22.12 22.26 21.3 22.53 22.96

## 2019-11-25 ENCOUNTER — Encounter: Payer: Medicare Other | Admitting: Family Medicine

## 2019-11-27 ENCOUNTER — Ambulatory Visit (INDEPENDENT_AMBULATORY_CARE_PROVIDER_SITE_OTHER): Payer: Medicare Other | Admitting: Family Medicine

## 2019-11-27 ENCOUNTER — Encounter: Payer: Self-pay | Admitting: Family Medicine

## 2019-11-27 ENCOUNTER — Other Ambulatory Visit: Payer: Self-pay

## 2019-11-27 ENCOUNTER — Encounter (INDEPENDENT_AMBULATORY_CARE_PROVIDER_SITE_OTHER): Payer: Self-pay

## 2019-11-27 VITALS — BP 120/78 | HR 90 | Temp 98.0°F | Resp 15 | Ht 72.0 in | Wt 162.0 lb

## 2019-11-27 DIAGNOSIS — Z0000 Encounter for general adult medical examination without abnormal findings: Secondary | ICD-10-CM | POA: Diagnosis not present

## 2019-11-27 DIAGNOSIS — Z125 Encounter for screening for malignant neoplasm of prostate: Secondary | ICD-10-CM

## 2019-11-27 DIAGNOSIS — E559 Vitamin D deficiency, unspecified: Secondary | ICD-10-CM

## 2019-11-27 DIAGNOSIS — Z13220 Encounter for screening for lipoid disorders: Secondary | ICD-10-CM

## 2019-11-27 DIAGNOSIS — Z1211 Encounter for screening for malignant neoplasm of colon: Secondary | ICD-10-CM

## 2019-11-27 DIAGNOSIS — I10 Essential (primary) hypertension: Secondary | ICD-10-CM

## 2019-11-27 MED ORDER — AMLODIPINE BESYLATE 2.5 MG PO TABS: 2.5000 mg | ORAL_TABLET | Freq: Every day | ORAL | 5 refills | Status: DC

## 2019-11-27 MED ORDER — GABAPENTIN 800 MG PO TABS: 800.0000 mg | ORAL_TABLET | Freq: Three times a day (TID) | ORAL | 5 refills | Status: DC

## 2019-11-27 NOTE — Progress Notes (Signed)
   Jimmy Fritz     MRN: [id number]      DOB: 12-13-1960   HPI: Patient is in for annual physical exam. No other health concerns are expressed or addressed at the visit. Recent labs, if available are reviewed. Immunization is reviewed , and  updated if needed.    PE; BP 120/78   Pulse 90   Temp 98 F (36.7 C) (Temporal)   Resp 15   Ht 6' (1.829 m)   Wt 162 lb (73.5 kg)   SpO2 97%   BMI 21.97 kg/m   Pleasant male, alert and oriented x 3, in no cardio-pulmonary distress. Afebrile. HEENT No facial trauma or asymetry. Sinuses non tender. EOMI External ears normal,  Neck: supple, no adenopathy,JVD or thyromegaly.No bruits.  Chest: Clear to ascultation bilaterally.No crackles or wheezes. Non tender to palpation  Cardiovascular system; Heart sounds normal,  S1 and  S2 ,no S3.  No murmur, or thrill. Apical beat not displaced Peripheral pulses normal.  Abdomen: Soft, non tender, no organomegaly or masses. No bruits. Bowel sounds normal. No guarding, tenderness or rebound.    Musculoskeletal exam: Full ROM of spine, hips , shoulders and knees. No deformity ,swelling or crepitus noted. No muscle wasting or atrophy.   Neurologic: Cranial nerves 2 to 12 intact. Power, tone ,sensation and reflexes normal throughout. No disturbance in gait. No tremor.  Skin: Intact, no ulceration, erythema , scaling or rash noted. Pigmentation normal throughout  Psych; Normal mood and affect. Judgement and concentration normal   Assessment & Plan:  Annual physical exam Annual exam as documented. Counseling done  re healthy lifestyle involving commitment to 150 minutes exercise per week, heart healthy diet, and attaining healthy weight.The importance of adequate sleep also discussed. Regular seat belt use and home safety, is also discussed. Changes in health habits are decided on by the patient with goals and time frames  set for achieving them. Immunization and cancer  screening needs are specifically addressed at this visit.

## 2019-11-27 NOTE — Addendum Note (Signed)
Addended by: Abner Greenspan on: 11/27/2019 02:50 PM   Modules accepted: Orders

## 2019-11-27 NOTE — Patient Instructions (Addendum)
F/U in office with MD end august, call if you need me sooner  Wellness past due please schedule  Call [email or web address] for  Help with smoking cessation, now smoking 10 to 15/day  Fasting CBC, lipid, cmp and eGFr, pSA, TSH and vitamin D are due  You are referred for colonscopy, need to go!  It is important that you exercise regularly at least 30 minutes 5 times a week. If you develop chest pain, have severe difficulty breathing, or feel very tired, stop exercising immediately and seek medical attention   Think about what you will eat, plan ahead. Choose " clean, green, fresh or frozen" over canned, processed or packaged foods which are more sugary, salty and fatty. 70 to 75% of food eaten should be vegetables and fruit. Three meals at set times with snacks allowed between meals, but they must be fruit or vegetables. Aim to eat over a 12 hour period , example 7 am to 7 pm, and STOP after  your last meal of the day. Drink water,generally about 64 ounces per day, no other drink is as healthy. Fruit juice is best enjoyed in a healthy way, by EATING the fruit.  Please work on [place] smoking entirely for your healthy  Colon cancer screening still due , now over due!  Thanks for choosing [facility], we consider it a privelige to serve you. Best for 2021!

## 2019-11-27 NOTE — Assessment & Plan Note (Signed)

## 2019-12-02 ENCOUNTER — Encounter: Payer: Self-pay | Admitting: Gastroenterology

## 2020-02-24 ENCOUNTER — Encounter: Payer: Self-pay | Admitting: *Deleted

## 2020-02-24 ENCOUNTER — Ambulatory Visit: Payer: Medicare Other | Admitting: Nurse Practitioner

## 2020-02-24 ENCOUNTER — Other Ambulatory Visit: Payer: Self-pay

## 2020-02-24 ENCOUNTER — Encounter: Payer: Self-pay | Admitting: Nurse Practitioner

## 2020-02-24 DIAGNOSIS — Z0000 Encounter for general adult medical examination without abnormal findings: Secondary | ICD-10-CM | POA: Insufficient documentation

## 2020-02-24 MED ORDER — NA SULFATE-K SULFATE-MG SULF 17.5-3.13-1.6 GM/177ML PO SOLN: 1.0000 | Freq: Once | ORAL | 0 refills | Status: AC
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
## 2020-02-24 NOTE — Progress Notes (Signed)
CC'ED TO PCP 

## 2020-02-24 NOTE — Assessment & Plan Note (Signed)
59 year old male patient currently due for first ever colonoscopy.  Generally asymptomatic from a GI standpoint.  He was brought to the office due to medications likely necessitating augmented sedation.  Explained the procedure in detail and he is agreeable to continue.  We will proceed with scheduling his colonoscopy.  Proceed with TCS with Dr. Gala Romney (in the absence of Dr. Oneida Alar) on propofol/MAC in near future: the risks, benefits, and alternatives have been discussed with the patient in detail. The patient states understanding and desires to proceed.  The patient is currently on Adderall and Neurontin.  History of schizophrenia and bipolar as well as anxiety.The patient is not on any other anticoagulants, anxiolytics, chronic pain medications, antidepressants, antidiabetics, or iron supplements.  We will plan for the procedure on propofol/MAC to promote adequate sedation.

## 2020-02-24 NOTE — Patient Instructions (Signed)
Your health issues we discussed today were:   Need for colonoscopy: 1. We will schedule your colonoscopy for you 2. Further recommendations will follow your colonoscopy 3. Call us if you have any problems or significant symptoms such as a lot of rectal bleeding or worsening abdominal pain  Overall I recommend:  1. Continue your other current medications 2. Return for follow-up based on recommendations made after your colonoscopy 3. Call if you have any questions or concerns   At [facility] we value your feedback. You may receive a survey about your visit today. Please share your experience as we strive to create trusting relationships with our patients to provide genuine, compassionate, quality care.  We appreciate your understanding and patience as we review any laboratory studies, imaging, and other diagnostic tests that are ordered as we care for you. Our office policy is 5 business days for review of these results, and any emergent or urgent results are addressed in a timely manner for your best interest. If you do not hear from our office in 1 week, please contact us.   We also encourage the use of MyChart, which contains your medical information for your review as well. If you are not enrolled in this feature, an access code is on this after visit summary for your convenience. Thank you for allowing [place] to be involved in your care.  It was great to see you today!  I hope you have a great Summer!!

## 2020-05-10 ENCOUNTER — Other Ambulatory Visit: Payer: Self-pay

## 2020-05-10 ENCOUNTER — Encounter: Payer: Medicare Other | Admitting: *Deleted

## 2020-05-11 ENCOUNTER — Encounter: Payer: Medicare Other | Admitting: Family Medicine

## 2020-05-28 NOTE — Patient Instructions (Signed)
Your procedure is scheduled on: 06/03/2020  Report to [facility] at  10:30   AM.  Call this number if you have problems the morning of surgery: [phone]   Remember:              Follow Directions on the letter you received from Your Physician's office regarding the Bowel Prep              No Smoking the day of Procedure :   Take these medicines the morning of surgery with A SIP OF WATER: Amlodipine, Adderall, gabapentin if needed   Do not wear jewelry, make-up or nail polish.    Do not bring valuables to the hospital.  Contacts, dentures or bridgework may not be worn into surgery.  .   Patients discharged the day of surgery will not be allowed to drive home.     Colonoscopy, Adult, Care After This sheet gives you information about how to care for yourself after your procedure. Your health care provider may also give you more specific instructions. If you have problems or questions, contact your health care provider. What can I expect after the procedure? After the procedure, it is common to have:  A small amount of blood in your stool for 24 hours after the procedure.  Some gas.  Mild abdominal cramping or bloating.  Follow these instructions at home: General instructions   For the first 24 hours after the procedure: ? Do not drive or use machinery. ? Do not sign important documents. ? Do not drink alcohol. ? Do your regular daily activities at a slower pace than normal. ? Eat soft, easy-to-digest foods. ? Rest often.  Take over-the-counter or prescription medicines only as told by your health care provider.  It is up to you to get the results of your procedure. Ask your health care provider, or the department performing the procedure, when your results will be ready. Relieving cramping and bloating  Try walking around when you have cramps or feel bloated.  Apply heat to your abdomen as told by your health care provider. Use a heat source that your health care  provider recommends, such as a moist heat pack or a heating pad. ? Place a towel between your skin and the heat source. ? Leave the heat on for 20-30 minutes. ? Remove the heat if your skin turns bright red. This is especially important if you are unable to feel pain, heat, or cold. You may have a greater risk of getting burned. Eating and drinking  Drink enough fluid to keep your urine clear or pale yellow.  Resume your normal diet as instructed by your health care provider. Avoid heavy or fried foods that are hard to digest.  Avoid drinking alcohol for as long as instructed by your health care provider. Contact a health care provider if:  You have blood in your stool 2-3 days after the procedure. Get help right away if:  You have more than a small spotting of blood in your stool.  You pass large blood clots in your stool.  Your abdomen is swollen.  You have nausea or vomiting.  You have a fever.  You have increasing abdominal pain that is not relieved with medicine. This information is not intended to replace advice given to you by your health care provider. Make sure you discuss any questions you have with your health care provider. Document Released: 06/20/2004 Document Revised: 07/31/2016 Document Reviewed: 01/18/2016 Elsevier Interactive Patient Education  2018 [place].

## 2020-06-01 ENCOUNTER — Telehealth: Payer: Self-pay | Admitting: *Deleted

## 2020-06-01 ENCOUNTER — Encounter (HOSPITAL_COMMUNITY): Payer: Self-pay

## 2020-06-01 ENCOUNTER — Encounter (HOSPITAL_COMMUNITY)
Admission: RE | Admit: 2020-06-01 | Discharge: 2020-06-01 | Disposition: A | Discharge status: Home / Self Care | Payer: Medicare Other | Source: Ambulatory Visit | Attending: Internal Medicine | Admitting: Internal Medicine

## 2020-06-01 ENCOUNTER — Other Ambulatory Visit (HOSPITAL_COMMUNITY)
Admission: RE | Admit: 2020-06-01 | Discharge: 2020-06-01 | Disposition: A | Discharge status: Home / Self Care | Payer: Medicare Other | Source: Ambulatory Visit | Attending: Internal Medicine | Admitting: Internal Medicine

## 2020-06-01 ENCOUNTER — Other Ambulatory Visit: Payer: Self-pay

## 2020-06-01 DIAGNOSIS — Z20822 Contact with and (suspected) exposure to covid-19: Secondary | ICD-10-CM | POA: Insufficient documentation

## 2020-06-01 DIAGNOSIS — Z01818 Encounter for other preprocedural examination: Secondary | ICD-10-CM | POA: Insufficient documentation

## 2020-06-01 HISTORY — DX: Unspecified convulsions: R56.9

## 2020-06-01 HISTORY — DX: Chronic obstructive pulmonary disease, unspecified: J44.9

## 2020-06-01 NOTE — Telephone Encounter (Signed)
-----   Message from Elsie Amis, RN sent at 06/01/2020  1:11 PM EDT ----- Regarding: no show Hello! Estil Daft did not show for his PAT and COVID today.

## 2020-06-01 NOTE — Pre-Procedure Instructions (Signed)
Interoffice note sent to Mindy @ [facility] because patient to not show for PAT.

## 2020-06-01 NOTE — Telephone Encounter (Signed)
Called pt x 3. Line does not ring at all.

## 2020-06-01 NOTE — Telephone Encounter (Signed)
Called pt, he is at the hospital now for appt.

## 2020-06-02 LAB — SARS CORONAVIRUS 2 (TAT 6-24 HRS): SARS Coronavirus 2: NEGATIVE

## 2020-06-03 ENCOUNTER — Encounter (HOSPITAL_COMMUNITY)
Admission: RE | Disposition: A | Discharge status: Home / Self Care | Payer: Self-pay | Source: Home / Self Care | Attending: Internal Medicine

## 2020-06-03 ENCOUNTER — Ambulatory Visit (HOSPITAL_COMMUNITY)
Admission: RE | Admit: 2020-06-03 | Discharge: 2020-06-03 | Disposition: A | Discharge status: Home / Self Care | Payer: Medicare Other | Attending: Internal Medicine | Admitting: Internal Medicine

## 2020-06-03 ENCOUNTER — Encounter (HOSPITAL_COMMUNITY): Payer: Self-pay | Admitting: Internal Medicine

## 2020-06-03 ENCOUNTER — Other Ambulatory Visit: Payer: Self-pay

## 2020-06-03 ENCOUNTER — Ambulatory Visit (HOSPITAL_COMMUNITY): Payer: Medicare Other | Admitting: Anesthesiology

## 2020-06-03 DIAGNOSIS — F319 Bipolar disorder, unspecified: Secondary | ICD-10-CM | POA: Insufficient documentation

## 2020-06-03 DIAGNOSIS — K635 Polyp of colon: Secondary | ICD-10-CM | POA: Diagnosis not present

## 2020-06-03 DIAGNOSIS — F209 Schizophrenia, unspecified: Secondary | ICD-10-CM | POA: Diagnosis not present

## 2020-06-03 DIAGNOSIS — K640 First degree hemorrhoids: Secondary | ICD-10-CM | POA: Insufficient documentation

## 2020-06-03 DIAGNOSIS — Z79899 Other long term (current) drug therapy: Secondary | ICD-10-CM | POA: Diagnosis not present

## 2020-06-03 DIAGNOSIS — Z7982 Long term (current) use of aspirin: Secondary | ICD-10-CM | POA: Insufficient documentation

## 2020-06-03 DIAGNOSIS — I10 Essential (primary) hypertension: Secondary | ICD-10-CM | POA: Insufficient documentation

## 2020-06-03 DIAGNOSIS — F17210 Nicotine dependence, cigarettes, uncomplicated: Secondary | ICD-10-CM | POA: Insufficient documentation

## 2020-06-03 DIAGNOSIS — J449 Chronic obstructive pulmonary disease, unspecified: Secondary | ICD-10-CM | POA: Insufficient documentation

## 2020-06-03 DIAGNOSIS — Z1211 Encounter for screening for malignant neoplasm of colon: Secondary | ICD-10-CM | POA: Insufficient documentation

## 2020-06-03 HISTORY — PX: POLYPECTOMY: SHX5525

## 2020-06-03 HISTORY — PX: COLONOSCOPY WITH PROPOFOL: SHX5780

## 2020-06-03 SURGERY — COLONOSCOPY WITH PROPOFOL
Anesthesia: General

## 2020-06-03 MED ORDER — CHLORHEXIDINE GLUCONATE CLOTH 2 % EX PADS: 6.0000 | MEDICATED_PAD | Freq: Once | CUTANEOUS | Status: DC

## 2020-06-03 MED ORDER — PROPOFOL 10 MG/ML IV BOLUS
INTRAVENOUS | Status: DC | PRN
  Administered 2020-06-03: 50 mg via INTRAVENOUS
  Administered 2020-06-03: 20 mg via INTRAVENOUS
  Administered 2020-06-03: 100 mg via INTRAVENOUS
  Administered 2020-06-03 (×2): 50 mg via INTRAVENOUS

## 2020-06-03 MED ORDER — LACTATED RINGERS IV SOLN: INTRAVENOUS | Status: DC | PRN

## 2020-06-03 MED ORDER — PROPOFOL 10 MG/ML IV BOLUS
INTRAVENOUS | Status: AC
  Filled 2020-06-03: qty 20

## 2020-06-03 MED ORDER — LACTATED RINGERS IV SOLN: INTRAVENOUS | Status: DC

## 2020-06-03 NOTE — Op Note (Signed)
[facility] Patient Name: Jimmy Fritz Procedure Date: 06/03/2020 11:33 AM MRN: [id number] Date of Birth: 03/07/1961 Attending MD: Gennette Pac , MD CSN: [id number] Age: 59 Admit Type: Outpatient Procedure:                Colonoscopy Indications:              Screening for colorectal malignant neoplasm Providers:                Gennette Pac, MD, Tammy Vaught, RN, Crystal                            Page, Burke Keels, Technician Referring MD:              Medicines:                Propofol per Anesthesia Complications:            No immediate complications. Estimated Blood Loss:     Estimated blood loss was minimal. Procedure:                Pre-Anesthesia Assessment:                           - Prior to the procedure, a History and Physical                            was performed, and patient medications and                            allergies were reviewed. The patient's tolerance of                            previous anesthesia was also reviewed. The risks                            and benefits of the procedure and the sedation                            options and risks were discussed with the patient.                            All questions were answered, and informed consent                            was obtained. Prior Anticoagulants: The patient has                            taken no previous anticoagulant or antiplatelet                            agents. ASA Grade Assessment: II - A patient with                            mild systemic disease. After reviewing the risks  and benefits, the patient was deemed in                            satisfactory condition to undergo the procedure.                           After obtaining informed consent, the colonoscope                            was passed under direct vision. Throughout the                            procedure, the patient's blood pressure, pulse, and                             oxygen saturations were monitored continuously. The                            CF-[id number] ([id number] scope was introduced through                            the anus and advanced to the the cecum, identified                            by appendiceal orifice and ileocecal valve. The                            colonoscopy was performed without difficulty. The                            patient tolerated the procedure well. The quality                            of the bowel preparation was adequate. Scope In: 12:21:04 PM Scope Out: 12:41:50 PM Scope Withdrawal Time: 0 hours 12 minutes 46 seconds  Total Procedure Duration: 0 hours 20 minutes 46 seconds  Findings:      The perianal and digital rectal examinations were normal.      A 5 mm polyp was found in the descending colon. The polyp was sessile.       The polyp was removed with a cold snare. Resection and retrieval were       complete. Estimated blood loss was minimal.      Non-bleeding internal hemorrhoids were found during retroflexion. The       hemorrhoids were moderate, medium-sized and Grade I (internal       hemorrhoids that do not prolapse).      The exam was otherwise without abnormality on direct and retroflexion       views. Impression:               - One 5 mm polyp in the descending colon, removed                            with a cold snare. Resected and retrieved.                           -  Non-bleeding internal hemorrhoids.                           - The examination was otherwise normal on direct                            and retroflexion views. Moderate Sedation:      Moderate (conscious) sedation was personally administered by an       anesthesia professional. The following parameters were monitored: oxygen       saturation, heart rate, blood pressure, and response to care. Recommendation:           - Patient has a contact number available for                            emergencies. The signs and  symptoms of potential                            delayed complications were discussed with the                            patient. Return to normal activities tomorrow.                            Written discharge instructions were provided to the                            patient.                           - Resume previous diet.                           - Continue present medications.                           - Repeat colonoscopy date to be determined after                            pending pathology results are reviewed for                            surveillance.                           - Return to GI office (date not yet determined). Procedure Code(s):        --- Professional ---                           [phone], Colonoscopy, flexible; with removal of                            tumor(s), polyp(s), or other lesion(s) by snare                            technique Diagnosis Code(s):        --- Professional ---  Z12.11, Encounter for screening for malignant                            neoplasm of colon                           K63.5, Polyp of colon                           K64.0, First degree hemorrhoids CPT copyright 2019 American Medical Association. All rights reserved. The codes documented in this report are preliminary and upon coder review may  be revised to meet current compliance requirements. Gerrit Friends. Keenon Leitzel, MD Gennette Pac, MD 06/03/2020 12:49:42 PM This report has been signed electronically. Number of Addenda: 0
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
## 2020-06-03 NOTE — Anesthesia Postprocedure Evaluation (Signed)
Anesthesia Post Note  Patient: Jimmy Fritz  Procedure(s) Performed: COLONOSCOPY WITH PROPOFOL (N/A ) POLYPECTOMY  Patient location during evaluation: PACU Anesthesia Type: General Level of consciousness: awake and alert, oriented, awake and patient cooperative Pain management: pain level controlled Vital Signs Assessment: post-procedure vital signs reviewed and stable Respiratory status: spontaneous breathing, respiratory function stable and nonlabored ventilation Cardiovascular status: stable Postop Assessment: no apparent nausea or vomiting Anesthetic complications: no   No complications documented.   Last Vitals:  Vitals:   06/03/20 1053 06/03/20 1057  BP:  140/80  Pulse:  (!) 103  Resp:  (!) 23  Temp: 36.8 C   SpO2:  96%    Last Pain:  Vitals:   06/03/20 1215  TempSrc:   PainSc: 0-No pain                 Treshawn Allen

## 2020-06-03 NOTE — Anesthesia Preprocedure Evaluation (Signed)
Anesthesia Evaluation  Patient identified by MRN, date of birth, ID band Patient awake    Reviewed: Allergy & Precautions, H&P , NPO status , Patient's Chart, lab work & pertinent test results, reviewed documented beta blocker date and time   Airway Mallampati: II  TM Distance: >3 FB Neck ROM: full    Dental no notable dental hx.    Pulmonary COPD,  COPD inhaler, Current Smoker,    Pulmonary exam normal breath sounds clear to auscultation       Cardiovascular Exercise Tolerance: Good hypertension,  Rhythm:regular Rate:Normal     Neuro/Psych Seizures -,  PSYCHIATRIC DISORDERS Anxiety Bipolar Disorder Schizophrenia    GI/Hepatic negative GI ROS, Neg liver ROS,   Endo/Other  negative endocrine ROS  Renal/GU negative Renal ROS  negative genitourinary   Musculoskeletal   Abdominal   Peds  Hematology negative hematology ROS (+)   Anesthesia Other Findings   Reproductive/Obstetrics negative OB ROS                             Anesthesia Physical Anesthesia Plan  ASA: II  Anesthesia Plan: General   Post-op Pain Management:    Induction:   PONV Risk Score and Plan: Propofol infusion  Airway Management Planned:   Additional Equipment:   Intra-op Plan:   Post-operative Plan:   Informed Consent: I have reviewed the patients History and Physical, chart, labs and discussed the procedure including the risks, benefits and alternatives for the proposed anesthesia with the patient or authorized representative who has indicated his/her understanding and acceptance.     Dental Advisory Given  Plan Discussed with: CRNA  Anesthesia Plan Comments:         Anesthesia Quick Evaluation

## 2020-06-03 NOTE — Transfer of Care (Signed)
Immediate Anesthesia Transfer of Care Note  Patient: Jimmy Fritz  Procedure(s) Performed: COLONOSCOPY WITH PROPOFOL (N/A ) POLYPECTOMY  Patient Location: PACU  Anesthesia Type:General  Level of Consciousness: awake, alert , oriented and patient cooperative  Airway & Oxygen Therapy: Patient Spontanous Breathing  Post-op Assessment: Report given to RN, Post -op Vital signs reviewed and stable and Patient moving all extremities X 4  Post vital signs: Reviewed and stable  Last Vitals:  Vitals Value Taken Time  BP    Temp    Pulse 93 06/03/20 1247  Resp 18 06/03/20 1247  SpO2 95 % 06/03/20 1247  Vitals shown include unvalidated device data.  Last Pain:  Vitals:   06/03/20 1215  TempSrc:   PainSc: 0-No pain         Complications: No complications documented.

## 2020-06-03 NOTE — Discharge Instructions (Signed)
Colonoscopy Discharge Instructions  Read the instructions outlined below and refer to this sheet in the next few weeks. These discharge instructions provide you with general information on caring for yourself after you leave the hospital. Your doctor may also give you specific instructions. While your treatment has been planned according to the most current medical practices available, unavoidable complications occasionally occur. If you have any problems or questions after discharge, call Dr. Jena Gauss at [phone]. ACTIVITY  You may resume your regular activity, but move at a slower pace for the next 24 hours.   Take frequent rest periods for the next 24 hours.   Walking will help get rid of the air and reduce the bloated feeling in your belly (abdomen).   No driving for 24 hours (because of the medicine (anesthesia) used during the test).    Do not sign any important legal documents or operate any machinery for 24 hours (because of the anesthesia used during the test).  NUTRITION  Drink plenty of fluids.   You may resume your normal diet as instructed by your doctor.   Begin with a light meal and progress to your normal diet. Heavy or fried foods are harder to digest and may make you feel sick to your stomach (nauseated).   Avoid alcoholic beverages for 24 hours or as instructed.  MEDICATIONS  You may resume your normal medications unless your doctor tells you otherwise.  WHAT YOU CAN EXPECT TODAY  Some feelings of bloating in the abdomen.   Passage of more gas than usual.   Spotting of blood in your stool or on the toilet paper.  IF YOU HAD POLYPS REMOVED DURING THE COLONOSCOPY:  No aspirin products for 7 days or as instructed.   No alcohol for 7 days or as instructed.   Eat a soft diet for the next 24 hours.  FINDING OUT THE RESULTS OF YOUR TEST Not all test results are available during your visit. If your test results are not back during the visit, make an appointment  with your caregiver to find out the results. Do not assume everything is normal if you have not heard from your caregiver or the medical facility. It is important for you to follow up on all of your test results.  SEEK IMMEDIATE MEDICAL ATTENTION IF:  You have more than a spotting of blood in your stool.   Your belly is swollen (abdominal distention).   You are nauseated or vomiting.   You have a temperature over 101.   You have abdominal pain or discomfort that is severe or gets worse throughout the day.   1 polyp removed from your colon  Further recommendations to follow pending review of pathology report  At patient request, I called Laverda Page [phone]    Colon Polyps  Polyps are tissue growths inside the body. Polyps can grow in many places, including the large intestine (colon). A polyp may be a round bump or a mushroom-shaped growth. You could have one polyp or several. Most colon polyps are noncancerous (benign). However, some colon polyps can become cancerous over time. Finding and removing the polyps early can help prevent this. What are the causes? The exact cause of colon polyps is not known. What increases the risk? You are more likely to develop this condition if you:  Have a family history of colon cancer or colon polyps.  Are older than 23 or older than 45 if you are African American.  Have inflammatory bowel disease, such as  ulcerative colitis or Crohn's disease.  Have certain hereditary conditions, such as: ? Familial adenomatous polyposis. ? Lynch syndrome. ? Turcot syndrome. ? Peutz-Jeghers syndrome.  Are overweight.  Smoke cigarettes.  Do not get enough exercise.  Drink too much alcohol.  Eat a diet that is high in fat and red meat and low in fiber.  Had childhood cancer that was treated with abdominal radiation. What are the signs or symptoms? Most polyps do not cause symptoms. If you have symptoms, they may include:  Blood  coming from your rectum when having a bowel movement.  Blood in your stool. The stool may look dark red or black.  Abdominal pain.  A change in bowel habits, such as constipation or diarrhea. How is this diagnosed? This condition is diagnosed with a colonoscopy. This is a procedure in which a lighted, flexible scope is inserted into the anus and then passed into the colon to examine the area. Polyps are sometimes found when a colonoscopy is done as part of routine cancer screening tests. How is this treated? Treatment for this condition involves removing any polyps that are found. Most polyps can be removed during a colonoscopy. Those polyps will then be tested for cancer. Additional treatment may be needed depending on the results of testing. Follow these instructions at home: Lifestyle  Maintain a healthy weight, or lose weight if recommended by your health care provider.  Exercise every day or as told by your health care provider.  Do not use any products that contain nicotine or tobacco, such as cigarettes and e-cigarettes. If you need help quitting, ask your health care provider.  If you drink alcohol, limit how much you have: ? 0-1 drink a day for women. ? 0-2 drinks a day for men.  Be aware of how much alcohol is in your drink. In the [place] one drink equals one 12 oz bottle of beer (355 mL), one 5 oz glass of wine (148 mL), or one 1 oz shot of hard liquor (44 mL). Eating and drinking   Eat foods that are high in fiber, such as fruits, vegetables, and whole grains.  Eat foods that are high in calcium and vitamin D, such as milk, cheese, yogurt, eggs, liver, fish, and broccoli.  Limit foods that are high in fat, such as fried foods and desserts.  Limit the amount of red meat and processed meat you eat, such as hot dogs, sausage, bacon, and lunch meats. General instructions  Keep all follow-up visits as told by your health care provider. This is important. ? This includes  having regularly scheduled colonoscopies. ? Talk to your health care provider about when you need a colonoscopy. Contact a health care provider if:  You have new or worsening bleeding during a bowel movement.  You have new or increased blood in your stool.  You have a change in bowel habits.  You lose weight for no known reason. Summary  Polyps are tissue growths inside the body. Polyps can grow in many places, including the colon.  Most colon polyps are noncancerous (benign), but some can become cancerous over time.  This condition is diagnosed with a colonoscopy.  Treatment for this condition involves removing any polyps that are found. Most polyps can be removed during a colonoscopy. This information is not intended to replace advice given to you by your health care provider. Make sure you discuss any questions you have with your health care provider. Document Revised: 02/21/2018 Document Reviewed: 02/21/2018 Elsevier Patient Education  2020 [employer]     Monitored Anesthesia Care, Care After These instructions provide you with information about caring for yourself after your procedure. Your health care provider may also give you more specific instructions. Your treatment has been planned according to current medical practices, but problems sometimes occur. Call your health care provider if you have any problems or questions after your procedure. What can I expect after the procedure? After your procedure, you may:  Feel sleepy for several hours.  Feel clumsy and have poor balance for several hours.  Feel forgetful about what happened after the procedure.  Have poor judgment for several hours.  Feel nauseous or vomit.  Have a sore throat if you had a breathing tube during the procedure. Follow these instructions at home: For at least 24 hours after the procedure:      Have a responsible adult stay with you. It is important to have someone help care for you  until you are awake and alert.  Rest as needed.  Do not: ? Participate in activities in which you could fall or become injured. ? Drive. ? Use heavy machinery. ? Drink alcohol. ? Take sleeping pills or medicines that cause drowsiness. ? Make important decisions or sign legal documents. ? Take care of children on your own. Eating and drinking  Follow the diet that is recommended by your health care provider.  If you vomit, drink water, juice, or soup when you can drink without vomiting.  Make sure you have little or no nausea before eating solid foods. General instructions  Take over-the-counter and prescription medicines only as told by your health care provider.  If you have sleep apnea, surgery and certain medicines can increase your risk for breathing problems. Follow instructions from your health care provider about wearing your sleep device: ? Anytime you are sleeping, including during daytime naps. ? While taking prescription pain medicines, sleeping medicines, or medicines that make you drowsy.  If you smoke, do not smoke without supervision.  Keep all follow-up visits as told by your health care provider. This is important. Contact a health care provider if:  You keep feeling nauseous or you keep vomiting.  You feel light-headed.  You develop a rash.  You have a fever. Get help right away if:  You have trouble breathing. Summary  For several hours after your procedure, you may feel sleepy and have poor judgment.  Have a responsible adult stay with you for at least 24 hours or until you are awake and alert. This information is not intended to replace advice given to you by your health care provider. Make sure you discuss any questions you have with your health care provider. Document Revised: 02/04/2018 Document Reviewed: 02/27/2016 Elsevier Patient Education  2020 [employer].

## 2020-06-04 ENCOUNTER — Encounter: Payer: Self-pay | Admitting: Internal Medicine

## 2020-06-04 LAB — SURGICAL PATHOLOGY

## 2020-06-08 ENCOUNTER — Encounter (HOSPITAL_COMMUNITY): Payer: Self-pay | Admitting: Internal Medicine

## 2020-07-14 ENCOUNTER — Ambulatory Visit: Payer: Medicare Other | Admitting: Family Medicine

## 2020-12-01 ENCOUNTER — Other Ambulatory Visit: Payer: Self-pay | Admitting: Family Medicine

## 2021-01-20 ENCOUNTER — Other Ambulatory Visit: Payer: Self-pay | Admitting: Family Medicine

## 2021-02-01 ENCOUNTER — Other Ambulatory Visit: Payer: Self-pay

## 2021-02-01 ENCOUNTER — Ambulatory Visit (INDEPENDENT_AMBULATORY_CARE_PROVIDER_SITE_OTHER): Payer: Medicare Other

## 2021-02-01 VITALS — BP 143/86 | Ht 72.0 in | Wt 170.0 lb

## 2021-02-01 DIAGNOSIS — Z0000 Encounter for general adult medical examination without abnormal findings: Secondary | ICD-10-CM | POA: Diagnosis not present
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
## 2021-02-01 NOTE — Patient Instructions (Signed)
[name] , Thank you for taking time to come for your Medicare Wellness Visit. I appreciate your ongoing commitment to your health goals. Please review the following plan we discussed and let me know if I can assist you in the future.   Screening recommendations/referrals: Colonoscopy: Due in 2031 Recommended yearly ophthalmology/optometry visit for glaucoma screening and checkup Recommended yearly dental visit for hygiene and checkup  Vaccinations: Influenza vaccine: declined Pneumococcal vaccine: n/a Tdap vaccine:declined Shingles vaccine: declined   Advanced directives: form given   Conditions/risks identified: smoking  Next appointment: February 02, 2022 3:00pm  Preventive Care 58-64 Years, Male Preventive care refers to lifestyle choices and visits with your health care provider that can promote health and wellness. What does preventive care include?  A yearly physical exam. This is also called an annual well check.  Dental exams once or twice a year.  Routine eye exams. Ask your health care provider how often you should have your eyes checked.  Personal lifestyle choices, including:  Daily care of your teeth and gums.  Regular physical activity.  Eating a healthy diet.  Avoiding tobacco and drug use.  Limiting alcohol use.  Practicing safe sex.  Taking low-dose aspirin every day starting at age 58. What happens during an annual well check? The services and screenings done by your health care provider during your annual well check will depend on your age, overall health, lifestyle risk factors, and family history of disease. Counseling  Your health care provider may ask you questions about your:  Alcohol use.  Tobacco use.  Drug use.  Emotional well-being.  Home and relationship well-being.  Sexual activity.  Eating habits.  Work and work Astronomer. Screening  You may have the following tests or measurements:  Height, weight, and  BMI.  Blood pressure.  Lipid and cholesterol levels. These may be checked every 5 years, or more frequently if you are over 58 years old.  Skin check.  Lung cancer screening. You may have this screening every year starting at age 58 if you have a 30-pack-year history of smoking and currently smoke or have quit within the past 15 years.  Fecal occult blood test (FOBT) of the stool. You may have this test every year starting at age 40.  Flexible sigmoidoscopy or colonoscopy. You may have a sigmoidoscopy every 5 years or a colonoscopy every 10 years starting at age 14.  Prostate cancer screening. Recommendations will vary depending on your family history and other risks.  Hepatitis C blood test.  Hepatitis B blood test.  Sexually transmitted disease (STD) testing.  Diabetes screening. This is done by checking your blood sugar (glucose) after you have not eaten for a while (fasting). You may have this done every 1-3 years. Discuss your test results, treatment options, and if necessary, the need for more tests with your health care provider. Vaccines  Your health care provider may recommend certain vaccines, such as:  Influenza vaccine. This is recommended every year.  Tetanus, diphtheria, and acellular pertussis (Tdap, Td) vaccine. You may need a Td booster every 10 years.  Zoster vaccine. You may need this after age 71.  Pneumococcal 13-valent conjugate (PCV13) vaccine. You may need this if you have certain conditions and have not been vaccinated.  Pneumococcal polysaccharide (PPSV23) vaccine. You may need one or two doses if you smoke cigarettes or if you have certain conditions. Talk to your health care provider about which screenings and vaccines you need and how often you need them. This  information is not intended to replace advice given to you by your health care provider. Make sure you discuss any questions you have with your health care provider. Document Released:  12/03/2015 Document Revised: 07/26/2016 Document Reviewed: 09/07/2015 Elsevier Interactive Patient Education  2017 [employer].  Fall Prevention in the Home Falls can cause injuries. They can happen to people of all ages. There are many things you can do to make your home safe and to help prevent falls. What can I do on the outside of my home?  Regularly fix the edges of walkways and driveways and fix any cracks.  Remove anything that might make you trip as you walk through a door, such as a raised step or threshold.  Trim any bushes or trees on the path to your home.  Use bright outdoor lighting.  Clear any walking paths of anything that might make someone trip, such as rocks or tools.  Regularly check to see if handrails are loose or broken. Make sure that both sides of any steps have handrails.  Any raised decks and porches should have guardrails on the edges.  Have any leaves, snow, or ice cleared regularly.  Use sand or salt on walking paths during winter.  Clean up any spills in your garage right away. This includes oil or grease spills. What can I do in the bathroom?  Use night lights.  Install grab bars by the toilet and in the tub and shower. Do not use towel bars as grab bars.  Use non-skid mats or decals in the tub or shower.  If you need to sit down in the shower, use a plastic, non-slip stool.  Keep the floor dry. Clean up any water that spills on the floor as soon as it happens.  Remove soap buildup in the tub or shower regularly.  Attach bath mats securely with double-sided non-slip rug tape.  Do not have throw rugs and other things on the floor that can make you trip. What can I do in the bedroom?  Use night lights.  Make sure that you have a light by your bed that is easy to reach.  Do not use any sheets or blankets that are too big for your bed. They should not hang down onto the floor.  Have a firm chair that has side arms. You can use this for  support while you get dressed.  Do not have throw rugs and other things on the floor that can make you trip. What can I do in the kitchen?  Clean up any spills right away.  Avoid walking on wet floors.  Keep items that you use a lot in easy-to-reach places.  If you need to reach something above you, use a strong step stool that has a grab bar.  Keep electrical cords out of the way.  Do not use floor polish or wax that makes floors slippery. If you must use wax, use non-skid floor wax.  Do not have throw rugs and other things on the floor that can make you trip. What can I do with my stairs?  Do not leave any items on the stairs.  Make sure that there are handrails on both sides of the stairs and use them. Fix handrails that are broken or loose. Make sure that handrails are as long as the stairways.  Check any carpeting to make sure that it is firmly attached to the stairs. Fix any carpet that is loose or worn.  Avoid having throw  rugs at the top or bottom of the stairs. If you do have throw rugs, attach them to the floor with carpet tape.  Make sure that you have a light switch at the top of the stairs and the bottom of the stairs. If you do not have them, ask someone to add them for you. What else can I do to help prevent falls?  Wear shoes that:  Do not have high heels.  Have rubber bottoms.  Are comfortable and fit you well.  Are closed at the toe. Do not wear sandals.  If you use a stepladder:  Make sure that it is fully opened. Do not climb a closed stepladder.  Make sure that both sides of the stepladder are locked into place.  Ask someone to hold it for you, if possible.  Clearly mark and make sure that you can see:  Any grab bars or handrails.  First and last steps.  Where the edge of each step is.  Use tools that help you move around (mobility aids) if they are needed. These include:  Canes.  Walkers.  Scooters.  Crutches.  Turn on the  lights when you go into a dark area. Replace any light bulbs as soon as they burn out.  Set up your furniture so you have a clear path. Avoid moving your furniture around.  If any of your floors are uneven, fix them.  If there are any pets around you, be aware of where they are.  Review your medicines with your doctor. Some medicines can make you feel dizzy. This can increase your chance of falling. Ask your doctor what other things that you can do to help prevent falls. This information is not intended to replace advice given to you by your health care provider. Make sure you discuss any questions you have with your health care provider. Document Released: 09/02/2009 Document Revised: 04/13/2016 Document Reviewed: 12/11/2014 Elsevier Interactive Patient Education  2017 [employer].

## 2021-02-21 ENCOUNTER — Ambulatory Visit (INDEPENDENT_AMBULATORY_CARE_PROVIDER_SITE_OTHER): Payer: Medicare Other | Admitting: Nurse Practitioner

## 2021-02-21 ENCOUNTER — Other Ambulatory Visit: Payer: Self-pay

## 2021-02-21 ENCOUNTER — Encounter: Payer: Self-pay | Admitting: Nurse Practitioner

## 2021-02-21 VITALS — BP 138/85 | HR 106 | Temp 98.4°F | Resp 18 | Ht 72.0 in | Wt 167.0 lb

## 2021-02-21 DIAGNOSIS — L989 Disorder of the skin and subcutaneous tissue, unspecified: Secondary | ICD-10-CM

## 2021-02-21 DIAGNOSIS — G8929 Other chronic pain: Secondary | ICD-10-CM

## 2021-02-21 DIAGNOSIS — M5440 Lumbago with sciatica, unspecified side: Secondary | ICD-10-CM

## 2021-02-21 DIAGNOSIS — I10 Essential (primary) hypertension: Secondary | ICD-10-CM | POA: Diagnosis not present

## 2021-02-21 DIAGNOSIS — Z0000 Encounter for general adult medical examination without abnormal findings: Secondary | ICD-10-CM | POA: Diagnosis not present

## 2021-02-21 NOTE — Assessment & Plan Note (Signed)
-  refilled gabapentin 

## 2021-02-21 NOTE — Assessment & Plan Note (Signed)
-  to back of head -states it has been non-healing x2 years -referral to derm
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
## 2021-02-21 NOTE — Patient Instructions (Signed)
Please have fasting labs drawn 2-3 days prior to your appointment so we can discuss the results during your office visit.  

## 2021-02-21 NOTE — Assessment & Plan Note (Signed)
-  refilled amlodipine BP Readings from Last 3 Encounters:  02/21/21 138/85  02/01/21 (!) 143/86  06/03/20 107/64

## 2021-03-23 ENCOUNTER — Encounter: Payer: Medicare Other | Admitting: Family Medicine

## 2021-04-11 ENCOUNTER — Encounter: Payer: Self-pay | Admitting: Family Medicine

## 2021-04-11 ENCOUNTER — Ambulatory Visit (INDEPENDENT_AMBULATORY_CARE_PROVIDER_SITE_OTHER): Payer: Medicare Other | Admitting: Family Medicine

## 2021-04-11 ENCOUNTER — Other Ambulatory Visit: Payer: Self-pay

## 2021-04-11 VITALS — BP 144/90 | HR 98 | Temp 98.6°F | Ht 72.0 in | Wt 164.0 lb

## 2021-04-11 DIAGNOSIS — I10 Essential (primary) hypertension: Secondary | ICD-10-CM

## 2021-04-11 DIAGNOSIS — M5440 Lumbago with sciatica, unspecified side: Secondary | ICD-10-CM

## 2021-04-11 DIAGNOSIS — Z13220 Encounter for screening for lipoid disorders: Secondary | ICD-10-CM | POA: Diagnosis not present

## 2021-04-11 DIAGNOSIS — M5442 Lumbago with sciatica, left side: Secondary | ICD-10-CM

## 2021-04-11 DIAGNOSIS — F17200 Nicotine dependence, unspecified, uncomplicated: Secondary | ICD-10-CM | POA: Diagnosis not present

## 2021-04-11 DIAGNOSIS — Z0000 Encounter for general adult medical examination without abnormal findings: Secondary | ICD-10-CM

## 2021-04-11 DIAGNOSIS — G8929 Other chronic pain: Secondary | ICD-10-CM | POA: Diagnosis not present

## 2021-04-11 DIAGNOSIS — R7302 Impaired glucose tolerance (oral): Secondary | ICD-10-CM

## 2021-04-11 DIAGNOSIS — F17210 Nicotine dependence, cigarettes, uncomplicated: Secondary | ICD-10-CM

## 2021-04-11 MED ORDER — GABAPENTIN 800 MG PO TABS: 800.0000 mg | ORAL_TABLET | Freq: Three times a day (TID) | ORAL | 5 refills | Status: DC

## 2021-04-11 MED ORDER — DOXYCYCLINE HYCLATE 100 MG PO TABS
100.0000 mg | ORAL_TABLET | Freq: Two times a day (BID) | ORAL | 0 refills | Status: DC
Start: 2021-04-11 — End: 2022-02-06

## 2021-04-11 MED ORDER — AMLODIPINE BESYLATE 5 MG PO TABS: 5.0000 mg | ORAL_TABLET | Freq: Every day | ORAL | 5 refills | Status: DC

## 2021-04-11 NOTE — Assessment & Plan Note (Signed)

## 2021-04-11 NOTE — Progress Notes (Signed)
Jimmy Fritz     MRN: [id number]      DOB: Aug 08, 1961   HPI: Patient is in for annual physical exam. C/o uncontrolled blood pressure C/o multiple tick bites and tender swelling on back of right neck whic increases and decreases in size, and place on posterior right head where he was bit by spider over  Years ago which still gives trouble Needs annual chest CT scan, not committing currently . Immunization is reviewed ,  Re educated and refuses Covid vaccine   PE; BP (!) 144/90   Pulse 98   Temp 98.6 F (37 C) (Temporal)   Ht 6' (1.829 m)   Wt 164 lb (74.4 kg)   SpO2 96%   BMI 22.24 kg/m   Pleasant male, alert and oriented x 3, in no cardio-pulmonary distress. Afebrile. HEENT No facial trauma or asymetry. Sinuses non tender. EOMI External ears normal,  Neck: supple, no adenopathy,JVD or thyromegaly.No bruits.  Chest: Clear to ascultation bilaterally.No crackles or wheezes.Decreased air entry bilaterally Non tender to palpation  Cardiovascular system; Heart sounds normal,  S1 and  S2 ,no S3.  No murmur, or thrill. Apical beat not displaced Peripheral pulses normal.  Abdomen: Soft, non tender, no organomegaly or masses. No bruits. Bowel sounds normal. No guarding, tenderness or rebound.    Musculoskeletal exam: Full ROM of spine, hips , shoulders and knees. No deformity ,swelling or crepitus noted. No muscle wasting or atrophy.   Neurologic: Cranial nerves 2 to 12 intact. Power, tone ,sensation  normal throughout. No disturbance in gait. No tremor.  Skin: Intact, , scaling  rash noted on posterior right scalp, max diameter approx 1.5 cm, tender right occipital node Pigmentation normal throughout  Psych; Normal mood and affect. Judgement and concentration normal   Assessment & Plan:  Annual physical exam Annual exam as documented. Counseling done  re healthy lifestyle involving commitment to 150 minutes exercise per week, heart healthy diet, and  attaining healthy weight.The importance of adequate sleep also discussed. Regular seat belt use and home safety, is also discussed. Changes in health habits are decided on by the patient with goals and time frames  set for achieving them. Immunization and cancer screening needs are specifically addressed at this visit.   Essential hypertension Uncontrolled, increase dose of amlodipine and re eval DASH diet and commitment to daily physical activity for a minimum of 30 minutes discussed and encouraged, as a part of hypertension management. The importance of attaining a healthy weight is also discussed.  BP/Weight 04/11/2021 02/21/2021 02/01/2021 06/03/2020 06/01/2020 02/24/2020 11/27/2019  Systolic BP 144 138 143 107 129 138 120  Diastolic BP 90 85 86 64 85 84 78  Wt. (Lbs) 164 167 170 164.99 165 165.4 162  BMI 22.24 22.65 23.06 22.38 22.38 22.43 21.97       Tobacco dependency Asked:confirms currently smokes  10 to 15 cigarettes/ day Assess: Unwilling to set a quit date, but is trying to cut back Advise: needs to QUIT to reduce risk of cancer, cardio and cerebrovascular disease Assist: counseled for 5 minutes and literature provided Arrange: follow up in 2 to 4 months   LOW BACK PAIN, CHRONIC Once daily gabapentin prescribed  IGT (impaired glucose tolerance) Patient educated about the importance of limiting  Carbohydrate intake , the need to commit to daily physical activity for a minimum of 30 minutes , and to commit weight loss. The fact that changes in all these areas will reduce or eliminate all together the development of  diabetes is stressed.   Diabetic Labs Latest Ref Rng & Units 06/24/2019 11/18/2018 03/01/2016 01/27/2015 02/26/2014  HbA1c <5.7 % of total Hgb 5.7(H) - 5.9(H) 5.9(H) 5.8(H)  Chol <200 mg/dL - 569 - - -  HDL >79 mg/dL - 47 - - -  Calc LDL mg/dL (calc) - 98 - - -  Triglycerides <150 mg/dL - 58 - - -  Creatinine 0.70 - 1.33 mg/dL 4.80 1.65 5.37 4.82 7.07   BP/Weight  04/11/2021 02/21/2021 02/01/2021 06/03/2020 06/01/2020 02/24/2020 11/27/2019  Systolic BP 144 138 143 107 129 138 120  Diastolic BP 90 85 86 64 85 84 78  Wt. (Lbs) 164 167 170 164.99 165 165.4 162  BMI 22.24 22.65 23.06 22.38 22.38 22.43 21.97   No flowsheet data found.   Updated lab needed at/ before next visit.

## 2021-04-11 NOTE — Patient Instructions (Signed)
F/U in 2 months, re evaluate blood pressure   Please work on quitting smoking, now 10/day   Reconsider and start getting chest scans yearly  New higher dose of amlodipine is 5 mg one daily for your blood pressure, at your pharmacy now    New is gabapentin once daily for back pain  No need for handicap sticker at thsi time, thankfully '  CBC, lipid, chem7 and EGFR today  Thanks for choosing [facility], we consider it a privelige to serve you.

## 2021-04-11 NOTE — Assessment & Plan Note (Signed)
Asked:confirms currently smokes  10 to 15 cigarettes/ day Assess: Unwilling to set a quit date, but is trying to cut back Advise: needs to QUIT to reduce risk of cancer, cardio and cerebrovascular disease Assist: counseled for 5 minutes and literature provided Arrange: follow up in 2 to 4 months

## 2021-04-11 NOTE — Assessment & Plan Note (Signed)
Uncontrolled, increase dose of amlodipine and re eval DASH diet and commitment to daily physical activity for a minimum of 30 minutes discussed and encouraged, as a part of hypertension management. The importance of attaining a healthy weight is also discussed.  BP/Weight 04/11/2021 02/21/2021 02/01/2021 06/03/2020 06/01/2020 02/24/2020 11/27/2019  Systolic BP 144 138 143 107 129 138 120  Diastolic BP 90 85 86 64 85 84 78  Wt. (Lbs) 164 167 170 164.99 165 165.4 162  BMI 22.24 22.65 23.06 22.38 22.38 22.43 21.97

## 2021-04-11 NOTE — Assessment & Plan Note (Addendum)
Once daily gabapentin prescribed

## 2021-04-11 NOTE — Assessment & Plan Note (Signed)
Patient educated about the importance of limiting  Carbohydrate intake , the need to commit to daily physical activity for a minimum of 30 minutes , and to commit weight loss. The fact that changes in all these areas will reduce or eliminate all together the development of diabetes is stressed.   Diabetic Labs Latest Ref Rng & Units 06/24/2019 11/18/2018 03/01/2016 01/27/2015 02/26/2014  HbA1c <5.7 % of total Hgb 5.7(H) - 5.9(H) 5.9(H) 5.8(H)  Chol <200 mg/dL - 664 - - -  HDL >40 mg/dL - 47 - - -  Calc LDL mg/dL (calc) - 98 - - -  Triglycerides <150 mg/dL - 58 - - -  Creatinine 0.70 - 1.33 mg/dL 3.47 4.25 9.56 3.87 5.64   BP/Weight 04/11/2021 02/21/2021 02/01/2021 06/03/2020 06/01/2020 02/24/2020 11/27/2019  Systolic BP 144 138 143 107 129 138 120  Diastolic BP 90 85 86 64 85 84 78  Wt. (Lbs) 164 167 170 164.99 165 165.4 162  BMI 22.24 22.65 23.06 22.38 22.38 22.43 21.97   No flowsheet data found.   Updated lab needed at/ before next visit.

## 2021-04-12 LAB — BMP8+EGFR
BUN/Creatinine Ratio: 13 (ref 9–20)
BUN: 14 mg/dL (ref 6–24)
CO2: 21 mmol/L (ref 20–29)
Calcium: 9.7 mg/dL (ref 8.7–10.2)
Chloride: 102 mmol/L (ref 96–106)
Creatinine, Ser: 1.04 mg/dL (ref 0.76–1.27)
Glucose: 103 mg/dL — ABNORMAL HIGH (ref 65–99)
Potassium: 4.6 mmol/L (ref 3.5–5.2)
Sodium: 138 mmol/L (ref 134–144)
eGFR: 84 mL/min/{1.73_m2} (ref >59)

## 2021-04-12 LAB — LIPID PANEL
Chol/HDL Ratio: 3.8 ratio (ref 0.0–5.0)
Cholesterol, Total: 174 mg/dL (ref 100–199)
HDL: 46 mg/dL (ref >39)
LDL Chol Calc (NIH): 115 mg/dL — ABNORMAL HIGH (ref 0–99)
Triglycerides: 66 mg/dL (ref 0–149)
VLDL Cholesterol Cal: 13 mg/dL (ref 5–40)

## 2021-04-12 LAB — CBC
Hematocrit: 47.4 % (ref 37.5–51.0)
Hemoglobin: 16.6 g/dL (ref 13.0–17.7)
MCH: 31 pg (ref 26.6–33.0)
MCHC: 35 g/dL (ref 31.5–35.7)
MCV: 88 fL (ref 79–97)
Platelets: 254 10*3/uL (ref 150–450)
RBC: 5.36 x10E6/uL (ref 4.14–5.80)
RDW: 12.6 % (ref 11.6–15.4)
WBC: 9.1 10*3/uL (ref 3.4–10.8)

## 2021-04-20 ENCOUNTER — Other Ambulatory Visit: Payer: Self-pay | Admitting: Family Medicine

## 2021-04-20 ENCOUNTER — Telehealth: Payer: Self-pay

## 2021-04-20 MED ORDER — DOXYCYCLINE HYCLATE 100 MG PO TABS: 100.0000 mg | ORAL_TABLET | Freq: Two times a day (BID) | ORAL | 0 refills | Status: DC

## 2021-04-20 NOTE — Telephone Encounter (Signed)
Pt is returning your call

## 2021-06-14 ENCOUNTER — Ambulatory Visit: Payer: Medicare Other | Admitting: Family Medicine

## 2021-06-21 NOTE — Progress Notes (Signed)
I connected with  Jimmy Fritz on 06/21/21 by audio enabled telemedicine application and verified that I am speaking with the correct person using two identifiers.   I discussed the limitations, risks, security and privacy concerns of performing an evaluation and management service by telephone and the availability of in person appointments. I also discussed with the patient that there may be a patient responsible charge related to this service. The patient expressed understanding and verbally consented to this telephonic visit.  Patient did not answer audio call. This encounter was created in error - please disregard.

## 2021-08-01 DIAGNOSIS — Z79899 Other long term (current) drug therapy: Secondary | ICD-10-CM | POA: Diagnosis not present

## 2021-08-03 ENCOUNTER — Ambulatory Visit: Payer: Medicare Other | Admitting: Dermatology

## 2021-11-03 ENCOUNTER — Other Ambulatory Visit: Payer: Self-pay | Admitting: Family Medicine

## 2022-01-23 ENCOUNTER — Other Ambulatory Visit: Payer: Self-pay | Admitting: Family Medicine

## 2022-02-06 ENCOUNTER — Ambulatory Visit (INDEPENDENT_AMBULATORY_CARE_PROVIDER_SITE_OTHER): Payer: Medicare Other

## 2022-02-06 ENCOUNTER — Other Ambulatory Visit: Payer: Self-pay

## 2022-02-06 VITALS — BP 111/78 | HR 104 | Ht 72.0 in | Wt 171.0 lb

## 2022-02-06 DIAGNOSIS — Z0000 Encounter for general adult medical examination without abnormal findings: Secondary | ICD-10-CM | POA: Diagnosis not present

## 2022-02-06 DIAGNOSIS — Z2821 Immunization not carried out because of patient refusal: Secondary | ICD-10-CM | POA: Diagnosis not present

## 2022-02-06 NOTE — Patient Instructions (Signed)
?  Mr. Ellwood , ?Thank you for taking time to come for your Medicare Wellness Visit. I appreciate your ongoing commitment to your health goals. Please review the following plan we discussed and let me know if I can assist you in the future.  ? ?These are the goals we discussed: ? Goals   ? ?  Eat more fruits and vegetables   ?  Patient would like to start eating about 3 servings of fruits and vegetables a day. ?  ?  Patient Stated   ?  Wants to get a part time job  ?  ?  Patient Stated   ?  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  ?  Quit Smoking   ? ?  ?  ?This is a list of the screening recommended for you and due dates:  ?Health Maintenance  ?Topic Date Due  ? Zoster (Shingles) Vaccine (1 of 2) Never done  ? Tetanus Vaccine  11/18/2028  ? Colon Cancer Screening  06/03/2030  ? Hepatitis C Screening: USPSTF Recommendation to screen - Ages 32-79 yo.  Completed  ? HIV Screening  Completed  ? HPV Vaccine  Aged Out  ? Flu Shot  Discontinued  ? COVID-19 Vaccine  Discontinued  ?  ?
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
## 2022-02-27 ENCOUNTER — Other Ambulatory Visit: Payer: Self-pay | Admitting: Family Medicine

## 2022-04-04 ENCOUNTER — Other Ambulatory Visit: Payer: Self-pay | Admitting: Family Medicine

## 2022-05-13 ENCOUNTER — Other Ambulatory Visit: Payer: Self-pay | Admitting: Family Medicine

## 2022-07-10 ENCOUNTER — Telehealth: Payer: Self-pay | Admitting: Family Medicine

## 2022-07-10 ENCOUNTER — Other Ambulatory Visit: Payer: Self-pay

## 2022-07-10 NOTE — Telephone Encounter (Signed)
Patient called in regard to amLODipine (NORVASC) 5 MG tablet     Patient is out of med , has med refill appt on Wednesday 8/23   Patient is request enough until wednesday

## 2022-07-11 ENCOUNTER — Other Ambulatory Visit: Payer: Self-pay

## 2022-07-11 MED ORDER — AMLODIPINE BESYLATE 5 MG PO TABS: 5.0000 mg | ORAL_TABLET | Freq: Every day | ORAL | 0 refills | Status: DC

## 2022-07-11 NOTE — Telephone Encounter (Signed)
No voicemail set up 

## 2022-07-11 NOTE — Telephone Encounter (Signed)
No voice mail.

## 2022-07-12 ENCOUNTER — Ambulatory Visit (INDEPENDENT_AMBULATORY_CARE_PROVIDER_SITE_OTHER): Payer: Medicare Other | Admitting: Family Medicine

## 2022-07-12 ENCOUNTER — Encounter: Payer: Self-pay | Admitting: Family Medicine

## 2022-07-12 VITALS — BP 124/72 | HR 78 | Resp 16 | Ht 72.0 in | Wt 159.1 lb

## 2022-07-12 DIAGNOSIS — Z125 Encounter for screening for malignant neoplasm of prostate: Secondary | ICD-10-CM

## 2022-07-12 DIAGNOSIS — F17200 Nicotine dependence, unspecified, uncomplicated: Secondary | ICD-10-CM | POA: Diagnosis not present

## 2022-07-12 DIAGNOSIS — I10 Essential (primary) hypertension: Secondary | ICD-10-CM | POA: Diagnosis not present

## 2022-07-12 MED ORDER — AMLODIPINE BESYLATE 5 MG PO TABS: 5.0000 mg | ORAL_TABLET | Freq: Every day | ORAL | 1 refills | Status: DC

## 2022-07-12 NOTE — Patient Instructions (Addendum)
Annual exam in early October, call if you need me before  Please get fasting CBC, lipid, cmp and EGFr, TSH, pSA  1 to 2 weeks before October visit  Blood pressure excellent, medication sent in  Reconsider flu vaccine  Thanks for choosing [facility], we consider it a privelige to serve you. Work on QUITTING smoking to protect heart lungs and brain

## 2022-07-16 ENCOUNTER — Encounter: Payer: Self-pay | Admitting: Family Medicine

## 2022-07-16 NOTE — Assessment & Plan Note (Signed)
Asked:confirms currently smokes cigarettes °Assess: Unwilling to set a quit date, but is cutting back °Advise: needs to QUIT to reduce risk of cancer, cardio and cerebrovascular disease °Assist: counseled for 5 minutes and literature provided °Arrange: follow up in 2 to 4 months ° °

## 2022-07-16 NOTE — Progress Notes (Signed)
   Lyndell Allaire     MRN: [id number]      DOB: 02/07/1961   HPI Mr. Nazir is here for follow up and re-evaluation of chronic medical conditions, medication management and review of any available recent lab and radiology data.  Preventive health is updated, specifically  Cancer screening and Immunization.   Questions or concerns regarding consultations or procedures which the PT has had in the interim are  addressed. The PT denies any adverse reactions to current medications since the last visit.  There are no new concerns.  There are no specific complaints   ROS Denies recent fever or chills. Denies sinus pressure, nasal congestion, ear pain or sore throat. Denies chest congestion, productive cough or wheezing. Denies chest pains, palpitations and leg swelling Denies abdominal pain, nausea, vomiting,diarrhea or constipation.   Denies dysuria, frequency, hesitancy or incontinence. Denies joint pain, swelling and limitation in mobility. Denies headaches, seizures, numbness, or tingling. Denies depression, anxiety or insomnia. Denies skin break down or rash.   PE  BP 124/72   Pulse 78   Resp 16   Ht 6' (1.829 m)   Wt 159 lb 1.9 oz (72.2 kg)   SpO2 94%   BMI 21.58 kg/m   Patient alert and oriented and in no cardiopulmonary distress.  HEENT: No facial asymmetry, EOMI,     Neck supple .  Chest: Clear to auscultation bilaterally.decreased air entry  CVS: S1, S2 no murmurs, no S3.Regular rate.  ABD: Soft non tender.   Ext: No edema  MS: Adequate ROM spine, shoulders, hips and knees.  Skin: Intact, no ulcerations or rash noted.  Psych: Good eye contact, normal affect. Memory intact not anxious or depressed appearing.  CNS: CN 2-12 intact, power,  normal throughout.no focal deficits noted.   Assessment & Plan  Essential hypertension Controlled, no change in medication DASH diet and commitment to daily physical activity for a minimum of 30 minutes discussed and  encouraged, as a part of hypertension management. The importance of attaining a healthy weight is also discussed.     07/12/2022    4:57 PM 07/12/2022    4:25 PM 02/06/2022    3:01 PM 04/11/2021    2:50 PM 04/11/2021    2:25 PM 02/21/2021    3:18 PM 02/01/2021    2:22 PM  BP/Weight  Systolic BP 124 131 111 144 148 138 143  Diastolic BP 72 78 78 90 90 85 86  Wt. (Lbs)  159.12 171  164 167 170  BMI  21.58 kg/m2 23.19 kg/m2  22.24 kg/m2 22.65 kg/m2 23.06 kg/m2       Tobacco dependency Asked:confirms currently smokes cigarettes Assess: Unwilling to set a quit date, but is cutting back Advise: needs to QUIT to reduce risk of cancer, cardio and cerebrovascular disease Assist: counseled for 5 minutes and literature provided Arrange: follow up in 2 to 4 months

## 2022-07-16 NOTE — Assessment & Plan Note (Signed)
Controlled, no change in medication DASH diet and commitment to daily physical activity for a minimum of 30 minutes discussed and encouraged, as a part of hypertension management. The importance of attaining a healthy weight is also discussed.     07/12/2022    4:57 PM 07/12/2022    4:25 PM 02/06/2022    3:01 PM 04/11/2021    2:50 PM 04/11/2021    2:25 PM 02/21/2021    3:18 PM 02/01/2021    2:22 PM  BP/Weight  Systolic BP 124 131 111 144 148 138 143  Diastolic BP 72 78 78 90 90 85 86  Wt. (Lbs)  159.12 171  164 167 170  BMI  21.58 kg/m2 23.19 kg/m2  22.24 kg/m2 22.65 kg/m2 23.06 kg/m2

## 2022-09-07 ENCOUNTER — Encounter: Payer: Medicare Other | Admitting: Family Medicine

## 2022-10-03 ENCOUNTER — Encounter: Payer: Medicare Other | Admitting: Family Medicine

## 2022-10-04 ENCOUNTER — Encounter: Payer: Self-pay | Admitting: Family Medicine

## 2022-11-21 ENCOUNTER — Other Ambulatory Visit: Payer: Self-pay

## 2022-11-21 ENCOUNTER — Telehealth: Payer: Self-pay | Admitting: Family Medicine

## 2022-11-21 MED ORDER — AMLODIPINE BESYLATE 5 MG PO TABS: 5.0000 mg | ORAL_TABLET | Freq: Every day | ORAL | 1 refills | Status: DC

## 2022-11-21 NOTE — Telephone Encounter (Signed)
Refill sent.

## 2022-11-21 NOTE — Telephone Encounter (Signed)
Patient called in needs refill on   amLODipine (NORVASC) 5 MG tablet    [facility] , [place]

## 2023-01-03 ENCOUNTER — Emergency Department (HOSPITAL_COMMUNITY)
Admission: EM | Admit: 2023-01-03 | Discharge: 2023-01-03 | Disposition: A | Discharge status: Home / Self Care | Payer: Medicare Other | Attending: Emergency Medicine | Admitting: Emergency Medicine

## 2023-01-03 ENCOUNTER — Emergency Department (HOSPITAL_COMMUNITY): Payer: Medicare Other

## 2023-01-03 ENCOUNTER — Other Ambulatory Visit: Payer: Self-pay

## 2023-01-03 ENCOUNTER — Encounter (HOSPITAL_COMMUNITY): Payer: Self-pay

## 2023-01-03 DIAGNOSIS — S61411A Laceration without foreign body of right hand, initial encounter: Secondary | ICD-10-CM | POA: Diagnosis not present

## 2023-01-03 DIAGNOSIS — W231XXA Caught, crushed, jammed, or pinched between stationary objects, initial encounter: Secondary | ICD-10-CM | POA: Diagnosis not present

## 2023-01-03 MED ORDER — LIDOCAINE-EPINEPHRINE (PF) 2 %-1:200000 IJ SOLN
20.0000 mL | Freq: Once | INTRAMUSCULAR | Status: DC
  Filled 2023-01-03: qty 20

## 2023-01-03 MED ORDER — TETANUS-DIPHTH-ACELL PERTUSSIS 5-2.5-18.5 LF-MCG/0.5 IM SUSY
0.5000 mL | PREFILLED_SYRINGE | Freq: Once | INTRAMUSCULAR | Status: DC
  Filled 2023-01-03: qty 0.5
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
## 2023-01-03 NOTE — Discharge Instructions (Addendum)
Please follow-up with primary care provider to be reevaluated for your recent injury and ER visit.  Please call the hand specialist I provided for you..  If you start noticing discharge or redness around the wound with pain please return to ER.

## 2023-01-03 NOTE — ED Notes (Signed)
X ray here for hand

## 2023-02-02 ENCOUNTER — Encounter: Payer: Medicare Other | Admitting: Family Medicine

## 2023-02-09 ENCOUNTER — Ambulatory Visit (INDEPENDENT_AMBULATORY_CARE_PROVIDER_SITE_OTHER): Payer: Medicare Other

## 2023-02-09 ENCOUNTER — Telehealth: Payer: Self-pay

## 2023-02-09 DIAGNOSIS — Z0000 Encounter for general adult medical examination without abnormal findings: Secondary | ICD-10-CM | POA: Diagnosis not present
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
## 2023-02-09 NOTE — Patient Instructions (Signed)
  Mr. Duque , Thank you for taking time to come for your Medicare Wellness Visit. I appreciate your ongoing commitment to your health goals. Please review the following plan we discussed and let me know if I can assist you in the future.   These are the goals we discussed:  Goals      Eat more fruits and vegetables     Patient would like to start eating about 3 servings of fruits and vegetables a day.     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     Quit Smoking        This is a list of the screening recommended for you and due dates:  Health Maintenance  Topic Date Due   Zoster (Shingles) Vaccine (1 of 2) Never done   Medicare Annual Wellness Visit  02/09/2024   DTaP/Tdap/Td vaccine (2 - Td or Tdap) 11/18/2028   Colon Cancer Screening  06/03/2030   Hepatitis C Screening: USPSTF Recommendation to screen - Ages 18-79 yo.  Completed   HIV Screening  Completed   HPV Vaccine  Aged Out   Flu Shot  Discontinued   COVID-19 Vaccine  Discontinued

## 2023-02-09 NOTE — Telephone Encounter (Signed)
Patient is scheduled to see you 03/06/23 he is requesting a refill on gabapentin ok to refill ?

## 2023-02-13 ENCOUNTER — Other Ambulatory Visit: Payer: Self-pay

## 2023-02-13 MED ORDER — GABAPENTIN 800 MG PO TABS: 800.0000 mg | ORAL_TABLET | Freq: Three times a day (TID) | ORAL | 5 refills | Status: DC

## 2023-02-13 NOTE — Telephone Encounter (Signed)
Refills sent

## 2023-03-06 ENCOUNTER — Ambulatory Visit (INDEPENDENT_AMBULATORY_CARE_PROVIDER_SITE_OTHER): Payer: Medicare Other | Admitting: Family Medicine

## 2023-03-06 ENCOUNTER — Encounter: Payer: Self-pay | Admitting: Family Medicine

## 2023-03-06 VITALS — BP 122/79 | HR 94 | Resp 16 | Ht 72.0 in | Wt 166.1 lb

## 2023-03-06 DIAGNOSIS — R7302 Impaired glucose tolerance (oral): Secondary | ICD-10-CM

## 2023-03-06 DIAGNOSIS — W57XXXA Bitten or stung by nonvenomous insect and other nonvenomous arthropods, initial encounter: Secondary | ICD-10-CM

## 2023-03-06 DIAGNOSIS — F17200 Nicotine dependence, unspecified, uncomplicated: Secondary | ICD-10-CM

## 2023-03-06 DIAGNOSIS — Z13220 Encounter for screening for lipoid disorders: Secondary | ICD-10-CM

## 2023-03-06 DIAGNOSIS — F17210 Nicotine dependence, cigarettes, uncomplicated: Secondary | ICD-10-CM

## 2023-03-06 DIAGNOSIS — E785 Hyperlipidemia, unspecified: Secondary | ICD-10-CM

## 2023-03-06 DIAGNOSIS — Z125 Encounter for screening for malignant neoplasm of prostate: Secondary | ICD-10-CM

## 2023-03-06 DIAGNOSIS — M5440 Lumbago with sciatica, unspecified side: Secondary | ICD-10-CM

## 2023-03-06 DIAGNOSIS — G8929 Other chronic pain: Secondary | ICD-10-CM | POA: Diagnosis not present

## 2023-03-06 DIAGNOSIS — I10 Essential (primary) hypertension: Secondary | ICD-10-CM | POA: Diagnosis not present

## 2023-03-06 DIAGNOSIS — R972 Elevated prostate specific antigen [PSA]: Secondary | ICD-10-CM | POA: Diagnosis not present

## 2023-03-06 MED ORDER — AMLODIPINE BESYLATE 5 MG PO TABS: 5.0000 mg | ORAL_TABLET | Freq: Every day | ORAL | 1 refills | Status: DC

## 2023-03-06 NOTE — Patient Instructions (Addendum)
Annual exam in mid October, call if you need me sooner  Labs   Ordered in 04/2022 to be re ordered and done today  Need to work on quitting smoking, so health continues to improve   Thankful you are doing much better  Need shingrix vaccines at your pharmacy

## 2023-03-07 ENCOUNTER — Other Ambulatory Visit: Payer: Self-pay | Admitting: Family Medicine

## 2023-03-07 DIAGNOSIS — W57XXXA Bitten or stung by nonvenomous insect and other nonvenomous arthropods, initial encounter: Secondary | ICD-10-CM

## 2023-03-08 LAB — CBC
Hematocrit: 47.8 % (ref 37.5–51.0)
Hemoglobin: 16.4 g/dL (ref 13.0–17.7)
MCH: 30.4 pg (ref 26.6–33.0)
MCHC: 34.3 g/dL (ref 31.5–35.7)
MCV: 89 fL (ref 79–97)
Platelets: 252 10*3/uL (ref 150–450)
RBC: 5.39 x10E6/uL (ref 4.14–5.80)
RDW: 12.5 % (ref 11.6–15.4)
WBC: 8 10*3/uL (ref 3.4–10.8)

## 2023-03-08 LAB — CMP14+EGFR
ALT: 26 IU/L (ref 0–44)
AST: 20 IU/L (ref 0–40)
Albumin/Globulin Ratio: 1.6 (ref 1.2–2.2)
Albumin: 4.5 g/dL (ref 3.8–4.9)
Alkaline Phosphatase: 124 IU/L — ABNORMAL HIGH (ref 44–121)
BUN/Creatinine Ratio: 21 — ABNORMAL HIGH (ref 9–20)
BUN: 18 mg/dL (ref 6–24)
Bilirubin Total: 0.6 mg/dL (ref 0.0–1.2)
CO2: 21 mmol/L (ref 20–29)
Calcium: 9.4 mg/dL (ref 8.7–10.2)
Chloride: 99 mmol/L (ref 96–106)
Creatinine, Ser: 0.85 mg/dL (ref 0.76–1.27)
Globulin, Total: 2.8 g/dL (ref 1.5–4.5)
Glucose: 103 mg/dL — ABNORMAL HIGH (ref 70–99)
Potassium: 4.4 mmol/L (ref 3.5–5.2)
Sodium: 137 mmol/L (ref 134–144)
Total Protein: 7.3 g/dL (ref 6.0–8.5)
eGFR: 100 mL/min/{1.73_m2} (ref >59)

## 2023-03-08 LAB — LIPID PANEL
Chol/HDL Ratio: 3.4 ratio (ref 0.0–5.0)
Cholesterol, Total: 171 mg/dL (ref 100–199)
HDL: 51 mg/dL (ref >39)
LDL Chol Calc (NIH): 108 mg/dL — ABNORMAL HIGH (ref 0–99)
Triglycerides: 60 mg/dL (ref 0–149)
VLDL Cholesterol Cal: 12 mg/dL (ref 5–40)

## 2023-03-08 LAB — TSH: TSH: 1.94 u[IU]/mL (ref 0.450–4.500)

## 2023-03-08 LAB — PSA: Prostate Specific Ag, Serum: 5.9 ng/mL — ABNORMAL HIGH (ref 0.0–4.0)

## 2023-03-09 LAB — SPOTTED FEVER GROUP ANTIBODIES
Spotted Fever Group IgG: <1:64 {titer}
Spotted Fever Group IgM: <1:64 {titer}

## 2023-03-09 LAB — LYME DISEASE SEROLOGY W/REFLEX: Lyme Total Antibody EIA: NEGATIVE

## 2023-03-12 ENCOUNTER — Encounter: Payer: Self-pay | Admitting: Family Medicine

## 2023-03-12 DIAGNOSIS — E785 Hyperlipidemia, unspecified: Secondary | ICD-10-CM | POA: Insufficient documentation

## 2023-03-12 DIAGNOSIS — W57XXXA Bitten or stung by nonvenomous insect and other nonvenomous arthropods, initial encounter: Secondary | ICD-10-CM | POA: Insufficient documentation

## 2023-03-12 DIAGNOSIS — R972 Elevated prostate specific antigen [PSA]: Secondary | ICD-10-CM | POA: Insufficient documentation

## 2023-03-12 NOTE — Assessment & Plan Note (Signed)
Controlled, no change in medication DASH diet and commitment to daily physical activity for a minimum of 30 minutes discussed and encouraged, as a part of hypertension management. The importance of attaining a healthy weight is also discussed.     03/06/2023   10:18 AM 01/03/2023    8:13 PM 01/03/2023    4:16 PM 01/03/2023    4:08 PM 07/12/2022    4:57 PM 07/12/2022    4:25 PM 02/06/2022    3:01 PM  BP/Weight  Systolic BP 122 148  157 124 131 111  Diastolic BP 79 108  99 72 78 78  Wt. (Lbs) 166.12  170   159.12 171  BMI 22.53 kg/m2  23.06 kg/m2   21.58 kg/m2 23.19 kg/m2     '

## 2023-03-12 NOTE — Assessment & Plan Note (Signed)
Managed with gabapentin  

## 2023-03-12 NOTE — Progress Notes (Signed)
Jimmy Fritz     MRN: [id number]      DOB: 09-14-1961   HPI Jimmy Fritz is here for follow up and re-evaluation of chronic medical conditions, medication management and review of any available recent lab and radiology data.  Preventive health is updated, specifically  Cancer screening and Immunization.   Treated by mental health with adderall, reports doing very well, denies depression anxiety or hallucination The PT denies any adverse reactions to current medications since the last visit.  C/o tick exposre and tick bites wants to be checked for tick borne illness ROS Denies recent fever or chills. Denies sinus pressure, nasal congestion, ear pain or sore throat. Denies chest congestion, productive cough or wheezing. Denies chest pains, palpitations and leg swelling Denies abdominal pain, nausea, vomiting,diarrhea or constipation.   Denies dysuria, frequency, hesitancy or incontinence. Denies joint pain, swelling and limitation in mobility. Denies headaches, seizures, numbness, or tingling. Denies  uncontrolled depression, anxiety or insomnia.    PE  BP 122/79   Pulse 94   Resp 16   Ht 6' (1.829 m)   Wt 166 lb 1.9 oz (75.4 kg)   SpO2 95%   BMI 22.53 kg/m   Patient alert and oriented and in no cardiopulmonary distress.  HEENT: No facial asymmetry, EOMI,     Neck supple .  Chest: Clear to auscultation bilaterally.Decreased air entry  CVS: S1, S2 no murmurs, no S3.Regular rate.  ABD: Soft non tender.   Ext: No edema  MS: Adequate ROM spine, shoulders, hips and knees.  Skin: Intact, erythema noted where ticks recently removed, no retained tick present  Psych: Good eye contact, normal affect. Memory intact not anxious or depressed appearing.  CNS: CN 2-12 intact, power,  normal throughout.no focal deficits noted.   Assessment & Plan  Essential hypertension Controlled, no change in medication DASH diet and commitment to daily physical activity for a minimum  of 30 minutes discussed and encouraged, as a part of hypertension management. The importance of attaining a healthy weight is also discussed.     03/06/2023   10:18 AM 01/03/2023    8:13 PM 01/03/2023    4:16 PM 01/03/2023    4:08 PM 07/12/2022    4:57 PM 07/12/2022    4:25 PM 02/06/2022    3:01 PM  BP/Weight  Systolic BP 122 148  157 124 131 111  Diastolic BP 79 108  99 72 78 78  Wt. (Lbs) 166.12  170   159.12 171  BMI 22.53 kg/m2  23.06 kg/m2   21.58 kg/m2 23.19 kg/m2     '  Tobacco dependency Asked:confirms currently smokes cigarettes Assess: Unwilling to set a quit date, but is cutting back Advise: needs to QUIT to reduce risk of cancer, cardio and cerebrovascular disease Assist: counseled for 5 minutes and literature provided Arrange: follow up in 2 to 4 months   LOW BACK PAIN, CHRONIC Managed with gabapentin  Elevated PSA, less than 10 ng/ml Updated lab remains elevated, refer to Urology  Dyslipidemia Hyperlipidemia:Low fat diet discussed and encouraged.   Lipid Panel  Lab Results  Component Value Date   CHOL 171 03/06/2023   HDL 51 03/06/2023   LDLCALC 108 (H) 03/06/2023   LDLDIRECT 100 03/01/2016   TRIG 60 03/06/2023   CHOLHDL 3.4 03/06/2023     Needs to reduce fat in diet  Tick bite Reports tick bites in several arms, neck, concerned about possible infection. On exam no skin breakdown or retained tick noted, serologic  testing ordered

## 2023-03-12 NOTE — Assessment & Plan Note (Signed)
Asked:confirms currently smokes cigarettes °Assess: Unwilling to set a quit date, but is cutting back °Advise: needs to QUIT to reduce risk of cancer, cardio and cerebrovascular disease °Assist: counseled for 5 minutes and literature provided °Arrange: follow up in 2 to 4 months ° °

## 2023-03-12 NOTE — Assessment & Plan Note (Signed)
Updated lab remains elevated, refer to Urology

## 2023-03-12 NOTE — Assessment & Plan Note (Signed)
Hyperlipidemia:Low fat diet discussed and encouraged.   Lipid Panel  Lab Results  Component Value Date   CHOL 171 03/06/2023   HDL 51 03/06/2023   LDLCALC 108 (H) 03/06/2023   LDLDIRECT 100 03/01/2016   TRIG 60 03/06/2023   CHOLHDL 3.4 03/06/2023     Needs to reduce fat in diet

## 2023-03-12 NOTE — Assessment & Plan Note (Signed)
Reports tick bites in several arms, neck, concerned about possible infection. On exam no skin breakdown or retained tick noted, serologic testing ordered

## 2023-09-05 ENCOUNTER — Encounter: Payer: Medicare Other | Admitting: Family Medicine

## 2023-11-02 ENCOUNTER — Encounter: Payer: Medicare Other | Admitting: Family Medicine

## 2023-11-09 ENCOUNTER — Encounter: Payer: Self-pay | Admitting: Family Medicine

## 2023-12-28 ENCOUNTER — Other Ambulatory Visit: Payer: Self-pay | Admitting: Family Medicine

## 2024-01-31 ENCOUNTER — Other Ambulatory Visit: Payer: Self-pay | Admitting: Family Medicine

## 2024-07-23 ENCOUNTER — Encounter: Payer: Self-pay | Admitting: Family Medicine

## 2024-07-23 ENCOUNTER — Ambulatory Visit (INDEPENDENT_AMBULATORY_CARE_PROVIDER_SITE_OTHER): Payer: Self-pay | Admitting: Family Medicine

## 2024-07-23 VITALS — BP 128/84 | HR 87 | Resp 16 | Ht 72.0 in | Wt 163.0 lb

## 2024-07-23 DIAGNOSIS — E559 Vitamin D deficiency, unspecified: Secondary | ICD-10-CM

## 2024-07-23 DIAGNOSIS — Z0000 Encounter for general adult medical examination without abnormal findings: Secondary | ICD-10-CM | POA: Diagnosis not present

## 2024-07-23 DIAGNOSIS — E785 Hyperlipidemia, unspecified: Secondary | ICD-10-CM | POA: Diagnosis not present

## 2024-07-23 DIAGNOSIS — F209 Schizophrenia, unspecified: Secondary | ICD-10-CM

## 2024-07-23 DIAGNOSIS — I10 Essential (primary) hypertension: Secondary | ICD-10-CM

## 2024-07-23 DIAGNOSIS — F319 Bipolar disorder, unspecified: Secondary | ICD-10-CM

## 2024-07-23 DIAGNOSIS — F17200 Nicotine dependence, unspecified, uncomplicated: Secondary | ICD-10-CM

## 2024-07-23 DIAGNOSIS — R972 Elevated prostate specific antigen [PSA]: Secondary | ICD-10-CM

## 2024-07-23 MED ORDER — GABAPENTIN 300 MG PO CAPS: 300.0000 mg | ORAL_CAPSULE | Freq: Every day | ORAL | 5 refills | Status: AC

## 2024-07-23 NOTE — Patient Instructions (Addendum)
 F/U in 5 months  Please work on quitting smoking  Urine drug test today  CBC,lipid, cmp and EGFr, TSH, pSA, vit D,  today  I am referring you to another psychiatrist Aderrall will be prescribed after result of drug screen is obtained , if abnormal it will not be prescribed from this office    It is important that you exercise regularly at least 30 minutes 5 times a week. If you develop chest pain, have severe difficulty breathing, or feel very tired, stop exercising immediately and seek medical attention   Thanks for choosing [facility], we consider it a privelige to serve you.

## 2024-07-23 NOTE — Progress Notes (Signed)
   Maleke Feria     MRN: [id number]      DOB: 1961-03-16  Chief Complaint  Patient presents with   Annual Exam    Cpe. Pt's psychiatrist left the practice, the new provider is refusing to fill adderall     HPI: Patient is in for annual physical exam. Immunization is reviewed , and  declines all vaccines  PE; BP 128/84   Pulse 87   Resp 16   Ht 6' (1.829 m)   Wt 163 lb 0.6 oz (74 kg)   SpO2 96%   BMI 22.11 kg/m   Pleasant male, alert and oriented x 3, in no cardio-pulmonary distress. Afebrile. HEENT No facial trauma or asymetry. Sinuses non tender. EOMI External ears normal,  Neck: supple, no adenopathy,JVD or thyromegaly.No bruits.  Chest: Clear to ascultation bilaterally.No crackles or wheezes. Non tender to palpation  Cardiovascular system; Heart sounds normal,  S1 and  S2 ,no S3.  No murmur, or thrill. Apical beat not displaced Peripheral pulses normal.  Abdomen: Soft, non tender, no organomegaly or masses. No bruits. Bowel sounds normal. No guarding, tenderness or rebound.    Musculoskeletal exam: Full ROM of spine, hips , shoulders and knees. No deformity ,swelling or crepitus noted. No muscle wasting or atrophy.   Neurologic: Cranial nerves 2 to 12 intact. Power, tone ,sensation and reflexes normal throughout. No disturbance in gait. No tremor.  Skin: Intact, no ulceration, erythema , scaling or rash noted. Pigmentation normal throughout  Psych; Normal mood and affect. Judgement and concentration normal   Assessment & Plan:  Tobacco dependency Asked:confirms currently smokes cigarettes Assess: Unwilling to set a quit date, but is cutting back Advise: needs to QUIT to reduce risk of cancer, cardio and cerebrovascular disease Assist: counseled for 5 minutes and literature provided Arrange: follow up in 2 to 4 months   Bipolar disorder (HCC) Longstanding h/o mental illness being treated by Psychiatry maintained on adderral, recently had  failed UDS and has been dismissed pt needs Psych management  Schizophrenia [facility]) Recently d/c from Psych practice due to failed UDS being treated with adderall will refer to psych for ongoing care, will write adderral short term if UDS is negative  Annual physical exam Annual exam as documented. Counseling done  re healthy lifestyle involving commitment to 150 minutes exercise per week, heart healthy diet, and attaining healthy weight.The importance of adequate sleep also discussed. . Immunization and cancer screening needs are specifically addressed at this visit.

## 2024-07-24 ENCOUNTER — Telehealth: Payer: Self-pay | Admitting: Family Medicine

## 2024-07-24 DIAGNOSIS — I10 Essential (primary) hypertension: Secondary | ICD-10-CM | POA: Diagnosis not present

## 2024-07-24 DIAGNOSIS — E785 Hyperlipidemia, unspecified: Secondary | ICD-10-CM | POA: Diagnosis not present

## 2024-07-24 NOTE — Telephone Encounter (Signed)
 Patient asking where can he take a pill amphetamine -dextroamphetamine  (ADDERALL) to a lab or where does he go.  Patient need to take it to get tested to be for sure if it is 100% adderall in the pill. Patient did a drug test and he failed saying it fentanyl in the test. Call patient at [phone].

## 2024-07-25 ENCOUNTER — Ambulatory Visit: Payer: Self-pay | Admitting: Family Medicine

## 2024-07-25 LAB — CBC WITH DIFFERENTIAL/PLATELET
Basophils Absolute: 0.1 x10E3/uL (ref 0.0–0.2)
Basos: 1 %
EOS (ABSOLUTE): 0.1 x10E3/uL (ref 0.0–0.4)
Eos: 1 %
Hematocrit: 47.4 % (ref 37.5–51.0)
Hemoglobin: 16 g/dL (ref 13.0–17.7)
Immature Grans (Abs): 0 x10E3/uL (ref 0.0–0.1)
Immature Granulocytes: 0 %
Lymphocytes Absolute: 6.3 x10E3/uL — ABNORMAL HIGH (ref 0.7–3.1)
Lymphs: 51 %
MCH: 30.9 pg (ref 26.6–33.0)
MCHC: 33.8 g/dL (ref 31.5–35.7)
MCV: 92 fL (ref 79–97)
Monocytes Absolute: 0.6 x10E3/uL (ref 0.1–0.9)
Monocytes: 5 %
Neutrophils Absolute: 5.2 x10E3/uL (ref 1.4–7.0)
Neutrophils: 42 %
Platelets: 259 x10E3/uL (ref 150–450)
RBC: 5.18 x10E6/uL (ref 4.14–5.80)
RDW: 13.1 % (ref 11.6–15.4)
WBC: 12.4 x10E3/uL — ABNORMAL HIGH (ref 3.4–10.8)

## 2024-07-25 LAB — CMP14+EGFR
ALT: 27 IU/L (ref 0–44)
AST: 15 IU/L (ref 0–40)
Albumin: 4.5 g/dL (ref 3.8–4.9)
Alkaline Phosphatase: 119 IU/L (ref 44–121)
BUN/Creatinine Ratio: 7 — ABNORMAL LOW (ref 10–24)
BUN: 12 mg/dL (ref 8–27)
Bilirubin Total: 0.7 mg/dL (ref 0.0–1.2)
CO2: 21 mmol/L (ref 20–29)
Calcium: 9.6 mg/dL (ref 8.6–10.2)
Chloride: 99 mmol/L (ref 96–106)
Creatinine, Ser: 1.74 mg/dL — ABNORMAL HIGH (ref 0.76–1.27)
Globulin, Total: 2.9 g/dL (ref 1.5–4.5)
Glucose: 84 mg/dL (ref 70–99)
Potassium: 4.8 mmol/L (ref 3.5–5.2)
Sodium: 136 mmol/L (ref 134–144)
Total Protein: 7.4 g/dL (ref 6.0–8.5)
eGFR: 44 mL/min/1.73 — ABNORMAL LOW (ref >59)

## 2024-07-25 LAB — VITAMIN D 25 HYDROXY (VIT D DEFICIENCY, FRACTURES): Vit D, 25-Hydroxy: 29.9 ng/mL — ABNORMAL LOW (ref 30.0–100.0)

## 2024-07-25 LAB — LIPID PANEL
Chol/HDL Ratio: 3.8 ratio (ref 0.0–5.0)
Cholesterol, Total: 196 mg/dL (ref 100–199)
HDL: 52 mg/dL (ref >39)
LDL Chol Calc (NIH): 132 mg/dL — ABNORMAL HIGH (ref 0–99)
Triglycerides: 67 mg/dL (ref 0–149)
VLDL Cholesterol Cal: 12 mg/dL (ref 5–40)

## 2024-07-25 LAB — PSA: Prostate Specific Ag, Serum: 6.5 ng/mL — ABNORMAL HIGH (ref 0.0–4.0)

## 2024-07-25 LAB — TSH: TSH: 1.59 u[IU]/mL (ref 0.450–4.500)

## 2024-07-28 NOTE — Telephone Encounter (Signed)
 Copied from CRM [id number]. Topic: Clinical - Lab/Test Results >> Jul 28, 2024  2:17 PM Leonette P wrote: Reason for CRM: pt called asking about his lab results.  He said he had a drug screening and needs his adderall  CB#  [phone]

## 2024-07-29 ENCOUNTER — Encounter: Payer: Self-pay | Admitting: Family Medicine

## 2024-07-29 ENCOUNTER — Telehealth: Payer: Self-pay

## 2024-07-29 DIAGNOSIS — F319 Bipolar disorder, unspecified: Secondary | ICD-10-CM | POA: Insufficient documentation

## 2024-07-29 DIAGNOSIS — F209 Schizophrenia, unspecified: Secondary | ICD-10-CM | POA: Insufficient documentation

## 2024-07-29 LAB — TOXASSURE SELECT 13 (MW), URINE

## 2024-07-29 MED ORDER — AMPHETAMINE-DEXTROAMPHETAMINE 30 MG PO TABS: 30.0000 mg | ORAL_TABLET | Freq: Two times a day (BID) | ORAL | 0 refills | Status: DC

## 2024-07-29 MED ORDER — AMPHETAMINE-DEXTROAMPHETAMINE 30 MG PO TABS: 30.0000 mg | ORAL_TABLET | Freq: Two times a day (BID) | ORAL | 0 refills | Status: AC

## 2024-07-29 NOTE — Assessment & Plan Note (Signed)
 Recently d/c from Psych practice due to failed UDS being treated with adderall will refer to psych for ongoing care, will write adderral short term if UDS is negative

## 2024-07-29 NOTE — Assessment & Plan Note (Signed)
 Longstanding h/o mental illness being treated by Psychiatry maintained on adderral, recently had failed UDS and has been dismissed pt needs Psych management

## 2024-07-29 NOTE — Assessment & Plan Note (Signed)
 Annual exam as documented. Counseling done  re healthy lifestyle involving commitment to 150 minutes exercise per week, heart healthy diet, and attaining healthy weight.The importance of adequate sleep also discussed.  Immunization and cancer screening needs are specifically addressed at this visit.

## 2024-07-29 NOTE — Telephone Encounter (Signed)
 Copied from CRM [id number]. Topic: Clinical - Lab/Test Results >> Jul 28, 2024  2:17 PM Leonette P wrote: Reason for CRM: pt called asking about his lab results.  He said he had a drug screening and needs his adderall  CB#  [phone]

## 2024-07-29 NOTE — Assessment & Plan Note (Signed)
Asked:confirms currently smokes cigarettes °Assess: Unwilling to set a quit date, but is cutting back °Advise: needs to QUIT to reduce risk of cancer, cardio and cerebrovascular disease °Assist: counseled for 5 minutes and literature provided °Arrange: follow up in 2 to 4 months ° °

## 2024-07-31 ENCOUNTER — Telehealth: Payer: Self-pay

## 2024-07-31 DIAGNOSIS — F319 Bipolar disorder, unspecified: Secondary | ICD-10-CM

## 2024-07-31 DIAGNOSIS — F209 Schizophrenia, unspecified: Secondary | ICD-10-CM

## 2024-07-31 NOTE — Telephone Encounter (Signed)
 Copied from CRM [id number]. Topic: Clinical - Lab/Test Results >> Jul 31, 2024 11:43 AM Montie POUR wrote: Reason for CRM:  Please have a nurse call Jimmy Fritz at [phone] at 4:30 PM today since he does not take his phone with him. He has questions about is lab results. He wants to discuss.  Prostate blood test has increased and his kidney function has decreased and is not in normal range I recommend evaluation by urology has been in the past with negative biopsies and he does however need to go again, if he agrees let me know, I will refer   Pals let him kn ow no urine test was done will need this done before any adderall can be prescribed for him based on what he reporte at viosit and I am referring him to Psych Thanks

## 2024-07-31 NOTE — Telephone Encounter (Signed)
Attempted to contact pt unable to reach him.

## 2024-09-22 ENCOUNTER — Ambulatory Visit: Payer: Self-pay

## 2024-09-24 MED ORDER — AMPHETAMINE-DEXTROAMPHETAMINE 30 MG PO TABS: 30.0000 mg | ORAL_TABLET | Freq: Two times a day (BID) | ORAL | 0 refills | Status: AC

## 2024-09-24 NOTE — Addendum Note (Signed)
 Addended by: ANTONETTA ROLLENE BRAVO on: 09/24/2024 08:19 AM   Modules accepted: Orders

## 2024-09-29 NOTE — Telephone Encounter (Signed)
 N/A. Sent detailed letter

## 2024-10-02 ENCOUNTER — Ambulatory Visit (INDEPENDENT_AMBULATORY_CARE_PROVIDER_SITE_OTHER)

## 2024-10-02 VITALS — BP 128/64 | HR 80 | Resp 12 | Ht 72.0 in | Wt 165.1 lb

## 2024-10-02 DIAGNOSIS — Z0000 Encounter for general adult medical examination without abnormal findings: Secondary | ICD-10-CM
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
## 2024-10-02 NOTE — Patient Instructions (Addendum)
 [name],  Thank you for taking the time for your Medicare Wellness Visit. I appreciate your continued commitment to your health goals. Please review the care plan we discussed, and feel free to reach out if I can assist you further.  Please note that Annual Wellness Visits do not include a physical exam. Some assessments may be limited, especially if the visit was conducted virtually. If needed, we may recommend an in-person follow-up with your provider.  Ongoing Care Seeing your primary care provider every 3 to 6 months helps us  monitor your health and provide consistent, personalized care.   Next Medicare Wellness Visit: October 06, 2025 at 1:50 pm  Referrals If a referral was made during today's visit and you haven't received any updates within two weeks, please contact the referred provider directly to check on the status.   Recommended Screenings:  Health Maintenance  Topic Date Due   Screening for Lung Cancer  Never done   Zoster (Shingles) Vaccine (1 of 2) 10/22/2024*   Pneumococcal Vaccine for age over 22 (1 of 2 - PCV) 07/23/2025*   Medicare Annual Wellness Visit  10/02/2025   DTaP/Tdap/Td vaccine (2 - Td or Tdap) 11/18/2028   Colon Cancer Screening  06/03/2030   Hepatitis C Screening  Completed   HIV Screening  Completed   Hepatitis B Vaccine  Aged Out   HPV Vaccine  Aged Out   Meningitis B Vaccine  Aged Out   Flu Shot  Discontinued   COVID-19 Vaccine  Discontinued  *Topic was postponed. The date shown is not the original due date.       10/02/2024    2:22 PM  Advanced Directives  Does Patient Have a Medical Advance Directive? No  Would patient like information on creating a medical advance directive? No - Patient declined    Vision: Annual vision screenings are recommended for early detection of glaucoma, cataracts, and diabetic retinopathy. These exams can also reveal signs of chronic conditions such as diabetes and high blood pressure.  Dental: Annual  dental screenings help detect early signs of oral cancer, gum disease, and other conditions linked to overall health, including heart disease and diabetes.  Please see the attached documents for additional preventive care recommendations.

## 2024-12-23 ENCOUNTER — Ambulatory Visit: Admitting: Family Medicine

## 2025-10-06 ENCOUNTER — Ambulatory Visit
